# Patient Record
Sex: Male | Born: 1935 | Race: White | Hispanic: No | Marital: Married | Smoking: Never smoker
Health system: Southern US, Community
[De-identification: ages and names within clinical notes are randomized; demographics above are authoritative.]

## PROBLEM LIST (undated history)

## (undated) DIAGNOSIS — I739 Peripheral vascular disease, unspecified: Secondary | ICD-10-CM

## (undated) DIAGNOSIS — F0390 Unspecified dementia without behavioral disturbance: Secondary | ICD-10-CM

## (undated) DIAGNOSIS — I10 Essential (primary) hypertension: Secondary | ICD-10-CM

---

## 2020-03-01 ENCOUNTER — Other Ambulatory Visit: Payer: Self-pay

## 2020-03-01 ENCOUNTER — Inpatient Hospital Stay (HOSPITAL_COMMUNITY)
Admission: EM | Admit: 2020-03-01 | Discharge: 2020-03-05 | DRG: 963 | Disposition: A | Discharge status: Skilled Nursing Facility | Payer: Medicare PPO | Attending: Surgery | Admitting: Surgery

## 2020-03-01 DIAGNOSIS — W1830XA Fall on same level, unspecified, initial encounter: Secondary | ICD-10-CM | POA: Diagnosis present

## 2020-03-01 DIAGNOSIS — E162 Hypoglycemia, unspecified: Secondary | ICD-10-CM | POA: Diagnosis present

## 2020-03-01 DIAGNOSIS — S270XXA Traumatic pneumothorax, initial encounter: Secondary | ICD-10-CM | POA: Diagnosis present

## 2020-03-01 DIAGNOSIS — Z885 Allergy status to narcotic agent status: Secondary | ICD-10-CM

## 2020-03-01 DIAGNOSIS — Y92129 Unspecified place in nursing home as the place of occurrence of the external cause: Secondary | ICD-10-CM

## 2020-03-01 DIAGNOSIS — Z8546 Personal history of malignant neoplasm of prostate: Secondary | ICD-10-CM

## 2020-03-01 DIAGNOSIS — W19XXXA Unspecified fall, initial encounter: Secondary | ICD-10-CM

## 2020-03-01 DIAGNOSIS — Z8551 Personal history of malignant neoplasm of bladder: Secondary | ICD-10-CM

## 2020-03-01 DIAGNOSIS — S066X1A Traumatic subarachnoid hemorrhage with loss of consciousness of 30 minutes or less, initial encounter: Secondary | ICD-10-CM

## 2020-03-01 DIAGNOSIS — R402243 Coma scale, best verbal response, confused conversation, at hospital admission: Secondary | ICD-10-CM | POA: Diagnosis present

## 2020-03-01 DIAGNOSIS — J939 Pneumothorax, unspecified: Secondary | ICD-10-CM

## 2020-03-01 DIAGNOSIS — H9190 Unspecified hearing loss, unspecified ear: Secondary | ICD-10-CM | POA: Diagnosis present

## 2020-03-01 DIAGNOSIS — Z9221 Personal history of antineoplastic chemotherapy: Secondary | ICD-10-CM

## 2020-03-01 DIAGNOSIS — S066X9A Traumatic subarachnoid hemorrhage with loss of consciousness of unspecified duration, initial encounter: Principal | ICD-10-CM | POA: Diagnosis present

## 2020-03-01 DIAGNOSIS — Z79899 Other long term (current) drug therapy: Secondary | ICD-10-CM

## 2020-03-01 DIAGNOSIS — U071 COVID-19: Secondary | ICD-10-CM | POA: Diagnosis present

## 2020-03-01 DIAGNOSIS — R402143 Coma scale, eyes open, spontaneous, at hospital admission: Secondary | ICD-10-CM | POA: Diagnosis present

## 2020-03-01 DIAGNOSIS — F0390 Unspecified dementia without behavioral disturbance: Secondary | ICD-10-CM | POA: Diagnosis present

## 2020-03-01 DIAGNOSIS — N179 Acute kidney failure, unspecified: Secondary | ICD-10-CM

## 2020-03-01 DIAGNOSIS — R55 Syncope and collapse: Secondary | ICD-10-CM | POA: Diagnosis not present

## 2020-03-01 DIAGNOSIS — R402353 Coma scale, best motor response, localizes pain, at hospital admission: Secondary | ICD-10-CM | POA: Diagnosis present

## 2020-03-01 DIAGNOSIS — Z882 Allergy status to sulfonamides status: Secondary | ICD-10-CM

## 2020-03-01 DIAGNOSIS — Z881 Allergy status to other antibiotic agents status: Secondary | ICD-10-CM

## 2020-03-01 DIAGNOSIS — S2231XA Fracture of one rib, right side, initial encounter for closed fracture: Secondary | ICD-10-CM | POA: Diagnosis present

## 2020-03-01 DIAGNOSIS — I609 Nontraumatic subarachnoid hemorrhage, unspecified: Secondary | ICD-10-CM

## 2020-03-01 HISTORY — DX: Essential (primary) hypertension: I10

## 2020-03-01 HISTORY — DX: Unspecified dementia, unspecified severity, without behavioral disturbance, psychotic disturbance, mood disturbance, and anxiety: F03.90

## 2020-03-01 HISTORY — DX: Peripheral vascular disease, unspecified: I73.9

## 2020-03-01 LAB — BASIC METABOLIC PANEL
Anion gap: 17 — ABNORMAL HIGH (ref 5–15)
BUN: 45 mg/dL — ABNORMAL HIGH (ref 8–23)
CO2: 23 mmol/L (ref 22–32)
Calcium: 9.7 mg/dL (ref 8.9–10.3)
Chloride: 98 mmol/L (ref 98–111)
Creatinine, Ser: 3.07 mg/dL — ABNORMAL HIGH (ref 0.61–1.24)
GFR calc Af Amer: 21 mL/min — ABNORMAL LOW (ref >60)
GFR calc non Af Amer: 18 mL/min — ABNORMAL LOW (ref >60)
Glucose, Bld: 238 mg/dL — ABNORMAL HIGH (ref 70–99)
Potassium: 5 mmol/L (ref 3.5–5.1)
Sodium: 138 mmol/L (ref 135–145)

## 2020-03-01 LAB — CBC
HCT: 46.7 % (ref 39.0–52.0)
Hemoglobin: 15.3 g/dL (ref 13.0–17.0)
MCH: 31.4 pg (ref 26.0–34.0)
MCHC: 32.8 g/dL (ref 30.0–36.0)
MCV: 95.9 fL (ref 80.0–100.0)
Platelets: 336 10*3/uL (ref 150–400)
RBC: 4.87 MIL/uL (ref 4.22–5.81)
RDW: 13.7 % (ref 11.5–15.5)
WBC: 13.4 10*3/uL — ABNORMAL HIGH (ref 4.0–10.5)
nRBC: 0 % (ref 0.0–0.2)

## 2020-03-01 LAB — CBG MONITORING, ED: Glucose-Capillary: 226 mg/dL — ABNORMAL HIGH (ref 70–99)

## 2020-03-01 NOTE — ED Triage Notes (Signed)
Pt presents to Ed BIB [facility] from [facility]. Per EMS pt had fall and syncope after fall. Per EMS CBG read low. Ems given D10. CBG - 210. No blood thinner. AMS at baseline.  EMS VS:  134/82 HR - 110 CBG - 285 20 RAC

## 2020-03-02 ENCOUNTER — Emergency Department (HOSPITAL_COMMUNITY): Payer: Medicare PPO

## 2020-03-02 ENCOUNTER — Inpatient Hospital Stay (HOSPITAL_COMMUNITY): Payer: Medicare PPO

## 2020-03-02 DIAGNOSIS — S066X9A Traumatic subarachnoid hemorrhage with loss of consciousness of unspecified duration, initial encounter: Secondary | ICD-10-CM | POA: Diagnosis present

## 2020-03-02 DIAGNOSIS — F0390 Unspecified dementia without behavioral disturbance: Secondary | ICD-10-CM | POA: Diagnosis present

## 2020-03-02 DIAGNOSIS — W1830XA Fall on same level, unspecified, initial encounter: Secondary | ICD-10-CM | POA: Diagnosis present

## 2020-03-02 DIAGNOSIS — N179 Acute kidney failure, unspecified: Secondary | ICD-10-CM | POA: Diagnosis present

## 2020-03-02 DIAGNOSIS — Z882 Allergy status to sulfonamides status: Secondary | ICD-10-CM | POA: Diagnosis not present

## 2020-03-02 DIAGNOSIS — H9190 Unspecified hearing loss, unspecified ear: Secondary | ICD-10-CM | POA: Diagnosis present

## 2020-03-02 DIAGNOSIS — Y92129 Unspecified place in nursing home as the place of occurrence of the external cause: Secondary | ICD-10-CM | POA: Diagnosis not present

## 2020-03-02 DIAGNOSIS — R55 Syncope and collapse: Secondary | ICD-10-CM | POA: Diagnosis present

## 2020-03-02 DIAGNOSIS — R402353 Coma scale, best motor response, localizes pain, at hospital admission: Secondary | ICD-10-CM | POA: Diagnosis present

## 2020-03-02 DIAGNOSIS — Z79899 Other long term (current) drug therapy: Secondary | ICD-10-CM | POA: Diagnosis not present

## 2020-03-02 DIAGNOSIS — R402243 Coma scale, best verbal response, confused conversation, at hospital admission: Secondary | ICD-10-CM | POA: Diagnosis present

## 2020-03-02 DIAGNOSIS — S270XXA Traumatic pneumothorax, initial encounter: Secondary | ICD-10-CM | POA: Diagnosis present

## 2020-03-02 DIAGNOSIS — Z881 Allergy status to other antibiotic agents status: Secondary | ICD-10-CM | POA: Diagnosis not present

## 2020-03-02 DIAGNOSIS — E162 Hypoglycemia, unspecified: Secondary | ICD-10-CM | POA: Diagnosis present

## 2020-03-02 DIAGNOSIS — Z885 Allergy status to narcotic agent status: Secondary | ICD-10-CM | POA: Diagnosis not present

## 2020-03-02 DIAGNOSIS — I609 Nontraumatic subarachnoid hemorrhage, unspecified: Secondary | ICD-10-CM

## 2020-03-02 DIAGNOSIS — U071 COVID-19: Secondary | ICD-10-CM | POA: Diagnosis present

## 2020-03-02 DIAGNOSIS — Z8546 Personal history of malignant neoplasm of prostate: Secondary | ICD-10-CM | POA: Diagnosis not present

## 2020-03-02 DIAGNOSIS — R402143 Coma scale, eyes open, spontaneous, at hospital admission: Secondary | ICD-10-CM | POA: Diagnosis present

## 2020-03-02 DIAGNOSIS — Z9221 Personal history of antineoplastic chemotherapy: Secondary | ICD-10-CM | POA: Diagnosis not present

## 2020-03-02 DIAGNOSIS — S2231XA Fracture of one rib, right side, initial encounter for closed fracture: Secondary | ICD-10-CM | POA: Diagnosis present

## 2020-03-02 DIAGNOSIS — Z8551 Personal history of malignant neoplasm of bladder: Secondary | ICD-10-CM | POA: Diagnosis not present

## 2020-03-02 LAB — HEPATIC FUNCTION PANEL
ALT: 25 U/L (ref 0–44)
AST: 30 U/L (ref 15–41)
Albumin: 3.7 g/dL (ref 3.5–5.0)
Alkaline Phosphatase: 66 U/L (ref 38–126)
Bilirubin, Direct: 0.3 mg/dL — ABNORMAL HIGH (ref 0.0–0.2)
Indirect Bilirubin: 0.7 mg/dL (ref 0.3–0.9)
Total Bilirubin: 1 mg/dL (ref 0.3–1.2)
Total Protein: 7.1 g/dL (ref 6.5–8.1)

## 2020-03-02 LAB — BASIC METABOLIC PANEL
Anion gap: 12 (ref 5–15)
BUN: 47 mg/dL — ABNORMAL HIGH (ref 8–23)
CO2: 27 mmol/L (ref 22–32)
Calcium: 9.4 mg/dL (ref 8.9–10.3)
Chloride: 102 mmol/L (ref 98–111)
Creatinine, Ser: 2.65 mg/dL — ABNORMAL HIGH (ref 0.61–1.24)
GFR calc Af Amer: 25 mL/min — ABNORMAL LOW (ref >60)
GFR calc non Af Amer: 21 mL/min — ABNORMAL LOW (ref >60)
Glucose, Bld: 162 mg/dL — ABNORMAL HIGH (ref 70–99)
Potassium: 4.4 mmol/L (ref 3.5–5.1)
Sodium: 141 mmol/L (ref 135–145)

## 2020-03-02 LAB — CBG MONITORING, ED: Glucose-Capillary: 190 mg/dL — ABNORMAL HIGH (ref 70–99)

## 2020-03-02 LAB — SARS CORONAVIRUS 2 BY RT PCR (HOSPITAL ORDER, PERFORMED IN ~~LOC~~ HOSPITAL LAB): SARS Coronavirus 2: POSITIVE — AB

## 2020-03-02 MED ORDER — HALOPERIDOL LACTATE 5 MG/ML IJ SOLN
5.0000 mg | Freq: Four times a day (QID) | INTRAMUSCULAR | Status: DC | PRN
  Administered 2020-03-02 – 2020-03-05 (×6): 5 mg via INTRAVENOUS
  Filled 2020-03-02 (×7): qty 1

## 2020-03-02 MED ORDER — ACETAMINOPHEN 500 MG PO TABS
1000.0000 mg | ORAL_TABLET | Freq: Three times a day (TID) | ORAL | Status: DC
  Administered 2020-03-02 – 2020-03-05 (×9): 1000 mg via ORAL
  Filled 2020-03-02 (×9): qty 2

## 2020-03-02 MED ORDER — DOCUSATE SODIUM 100 MG PO CAPS
100.0000 mg | ORAL_CAPSULE | Freq: Two times a day (BID) | ORAL | Status: DC
  Administered 2020-03-02 – 2020-03-04 (×3): 100 mg via ORAL
  Filled 2020-03-02 (×4): qty 1

## 2020-03-02 MED ORDER — ONDANSETRON 4 MG PO TBDP: 4.0000 mg | ORAL_TABLET | Freq: Four times a day (QID) | ORAL | Status: DC | PRN

## 2020-03-02 MED ORDER — SODIUM CHLORIDE 0.9 % IV SOLN: 250.0000 mL | INTRAVENOUS | Status: DC | PRN

## 2020-03-02 MED ORDER — SODIUM CHLORIDE 0.9% FLUSH: 3.0000 mL | INTRAVENOUS | Status: DC | PRN

## 2020-03-02 MED ORDER — LEVETIRACETAM 500 MG PO TABS
500.0000 mg | ORAL_TABLET | Freq: Two times a day (BID) | ORAL | Status: DC
  Administered 2020-03-02 – 2020-03-05 (×7): 500 mg via ORAL
  Filled 2020-03-02 (×7): qty 1

## 2020-03-02 MED ORDER — METHOCARBAMOL 500 MG PO TABS
1000.0000 mg | ORAL_TABLET | Freq: Three times a day (TID) | ORAL | Status: DC
  Administered 2020-03-02 – 2020-03-05 (×10): 1000 mg via ORAL
  Filled 2020-03-02 (×10): qty 2

## 2020-03-02 MED ORDER — SODIUM CHLORIDE 0.9% FLUSH
3.0000 mL | Freq: Two times a day (BID) | INTRAVENOUS | Status: DC
  Administered 2020-03-02 – 2020-03-05 (×6): 3 mL via INTRAVENOUS

## 2020-03-02 MED ORDER — ONDANSETRON HCL 4 MG/2ML IJ SOLN: 4.0000 mg | Freq: Four times a day (QID) | INTRAMUSCULAR | Status: DC | PRN

## 2020-03-02 MED ORDER — SODIUM CHLORIDE 0.9 % IV SOLN: INTRAVENOUS | Status: DC

## 2020-03-02 MED ORDER — ENOXAPARIN SODIUM 30 MG/0.3ML ~~LOC~~ SOLN
30.0000 mg | Freq: Two times a day (BID) | SUBCUTANEOUS | Status: DC
  Administered 2020-03-03: 30 mg via SUBCUTANEOUS
  Filled 2020-03-02: qty 0.3

## 2020-03-02 MED ORDER — ACETAMINOPHEN 500 MG PO TABS: 1000.0000 mg | ORAL_TABLET | Freq: Four times a day (QID) | ORAL | Status: DC

## 2020-03-02 NOTE — Progress Notes (Signed)
PT Cancellation/Discharge Note  Patient Details Name: Kareem Cathey MRN: [id number] DOB: Dec 07, 1932   Cancelled Treatment:    Reason Eval/Treat Not Completed: PT screened, no needs identified, will sign off   Attempted to call [facility] for information on pt's functional status with no answer. As coming to speak with pt's nurse, Eber Jones, she was on the phone with patient's wife. She reports patient has not ambulated in a very long time. She reports he also has not known family members for a very long time.    Jerolyn Center, PT Pager [phone]   Zena Amos 03/02/2020, 4:29 PM

## 2020-03-02 NOTE — H&P (Addendum)
Reason for Consult/Chief Complaint: fall, SAH Consultant: Rubin Payor, MD  Dylan Ball is an 86 y.o. male.   HPI: 38M with report of GLF at nursing home, [facility]. Reportedly no blood thinners. Patient unable to participate in history and history is obtained from EDP and chart review. EDP notes patient does not have a history of diabetes, however was hypoglycemic en route requiring administration of D10 with appropriate response. Reportedly has a recent COVID+ diagnosis, although is reportedly asymptomatic. Unknown date of positive test.   No past medical history on file. Reported history of bladder cancer.   No family history on file.  Social History:  has no history on file for tobacco use, alcohol use, and drug use.  Allergies:  Allergies  Allergen Reactions  . Bactrim [Sulfamethoxazole-Trimethoprim] Other (See Comments)    Unknown reaction - listed on [facility] 03/01/2020  . Butalbital-Asa-Caff-Codeine Other (See Comments)    Unknown reaction - listed on [facility] 03/01/2020   . Ciprofloxacin Other (See Comments)    Unknown reaction - listed on [facility] 03/01/2020   . Ciprofloxacin-Dexamethasone Other (See Comments)    Unknown reaction - listed on [facility] 03/01/2020   . Codeine Other (See Comments)    Unknown reaction - listed on [facility] 03/01/2020   . Guaifenesin-Codeine Other (See Comments)    Unknown reaction - listed on [facility] 03/01/2020   . Morphine And Related Other (See Comments)    Unknown reaction - listed on [facility] 03/01/2020   . Vicodin [Hydrocodone-Acetaminophen] Other (See Comments)    Unknown reaction - listed on [facility] 03/01/2020     Medications: I have reviewed the patient's current medications.  Results for orders placed or performed during the hospital encounter of 03/01/20 (from the past 48 hour(s))  Basic metabolic panel     Status: Abnormal   Collection Time: 03/01/20 10:02 PM  Result Value Ref Range   Sodium 138 135 - 145 mmol/L   Potassium 5.0 3.5 - 5.1 mmol/L   Chloride 98  98 - 111 mmol/L   CO2 23 22 - 32 mmol/L   Glucose, Bld 238 (H) 70 - 99 mg/dL    Comment: Glucose reference range applies only to samples taken after fasting for at least 8 hours.   BUN 45 (H) 8 - 23 mg/dL   Creatinine, Ser 7.25 (H) 0.61 - 1.24 mg/dL   Calcium 9.7 8.9 - 36.6 mg/dL   GFR calc non Af Amer 18 (L) >60 mL/min   GFR calc Af Amer 21 (L) >60 mL/min   Anion gap 17 (H) 5 - 15    Comment: Performed at [facility], [address], [place], [place] [address]  CBC     Status: Abnormal   Collection Time: 03/01/20 10:02 PM  Result Value Ref Range   WBC 13.4 (H) 4.0 - 10.5 K/uL   RBC 4.87 4.22 - 5.81 MIL/uL   Hemoglobin 15.3 13.0 - 17.0 g/dL   HCT 74.2 39 - 52 %   MCV 95.9 80.0 - 100.0 fL   MCH 31.4 26.0 - 34.0 pg   MCHC 32.8 30.0 - 36.0 g/dL   RDW 59.5 63.8 - 75.6 %   Platelets 336 150 - 400 K/uL   nRBC 0.0 0.0 - 0.2 %    Comment: Performed at [facility], [address], [place], [place] [address]  Hepatic function panel     Status: Abnormal   Collection Time: 03/01/20 10:02 PM  Result Value Ref Range  Total Protein 7.1 6.5 - 8.1 g/dL   Albumin 3.7 3.5 - 5.0 g/dL   AST 30 15 - 41 U/L   ALT 25 0 - 44 U/L   Alkaline Phosphatase 66 38 - 126 U/L   Total Bilirubin 1.0 0.3 - 1.2 mg/dL   Bilirubin, Direct 0.3 (H) 0.0 - 0.2 mg/dL   Indirect Bilirubin 0.7 0.3 - 0.9 mg/dL    Comment: Performed at [facility], [address], [place], [place] [address]  CBG monitoring, ED     Status: Abnormal   Collection Time: 03/01/20 10:15 PM  Result Value Ref Range   Glucose-Capillary 226 (H) 70 - 99 mg/dL    Comment: Glucose reference range applies only to samples taken after fasting for at least 8 hours.  CBG monitoring, ED     Status: Abnormal   Collection Time: 03/02/20 12:56 AM  Result Value Ref Range   Glucose-Capillary 190 (H) 70 - 99 mg/dL    Comment: Glucose reference range applies only to samples taken after fasting for at least 8 hours.    DG Chest 1  View  Addendum Date: 03/02/2020   ADDENDUM REPORT: 03/02/2020 02:00 ADDENDUM: These results were called by telephone at the time of interpretation on 03/02/2020 at 2:00 am to provider [facility] , who verbally acknowledged these results. Electronically Signed   By: Helyn Numbers MD   On: 03/02/2020 02:00   Result Date: 03/02/2020 CLINICAL DATA:  Altered mental status, COVID pneumonia EXAM: CHEST  1 VIEW COMPARISON:  None. FINDINGS: Small right apical pneumothorax is present. Minimal right basilar atelectasis. Lungs are otherwise clear. No pleural effusion. Cardiac size within normal limits. No acute bone abnormality. IMPRESSION: Small right apical pneumothorax. Electronically Signed: By: Helyn Numbers MD On: 03/02/2020 01:57   CT Head Wo Contrast  Result Date: 03/02/2020 CLINICAL DATA:  86 year old male with trauma. EXAM: CT HEAD WITHOUT CONTRAST CT CERVICAL SPINE WITHOUT CONTRAST TECHNIQUE: Multidetector CT imaging of the head and cervical spine was performed following the standard protocol without intravenous contrast. Multiplanar CT image reconstructions of the cervical spine were also generated. COMPARISON:  None. FINDINGS: CT HEAD FINDINGS Brain: There is a trace right temporal subarachnoid hemorrhage (13/4) as well as trace right posterior temporal/parietal subarachnoid hemorrhage (16/4). There is mild age-related atrophy and chronic microvascular ischemic changes. No mass effect or midline shift. Vascular: No hyperdense vessel or unexpected calcification. Skull: Normal. Negative for fracture or focal lesion. Sinuses/Orbits: There is a 2 cm left maxillary sinus retention cyst or polyp. There is partial opacification of the sphenoid air cells with air-fluid level. The mastoid air cells are clear. Other: None CT CERVICAL SPINE FINDINGS Alignment: No acute subluxation. Skull base and vertebrae: No acute fracture.  Osteopenia. Soft tissues and spinal canal: No prevertebral fluid or swelling. No  visible canal hematoma. Disc levels:  Multilevel degenerative changes. Upper chest: Partially visualized small right pneumothorax. Further evaluation with chest CT is recommended. Other: Bilateral thyroid nodules as well as advanced bilateral carotid bulb calcified plaques. IMPRESSION: 1. Trace right temporal and parietal subarachnoid hemorrhage. 2. No acute/traumatic cervical spine pathology. 3. Partially visualized small right pneumothorax. Further evaluation with chest CT is recommended. These results were called by telephone at the time of interpretation on 03/02/2020 at 1:42 am to provider [facility] , who verbally acknowledged these results. Electronically Signed   By: Elgie Collard M.D.   On: 03/02/2020 01:43   CT Cervical Spine Wo Contrast  Result Date: 03/02/2020 CLINICAL  DATA:  86 year old male with trauma. EXAM: CT HEAD WITHOUT CONTRAST CT CERVICAL SPINE WITHOUT CONTRAST TECHNIQUE: Multidetector CT imaging of the head and cervical spine was performed following the standard protocol without intravenous contrast. Multiplanar CT image reconstructions of the cervical spine were also generated. COMPARISON:  None. FINDINGS: CT HEAD FINDINGS Brain: There is a trace right temporal subarachnoid hemorrhage (13/4) as well as trace right posterior temporal/parietal subarachnoid hemorrhage (16/4). There is mild age-related atrophy and chronic microvascular ischemic changes. No mass effect or midline shift. Vascular: No hyperdense vessel or unexpected calcification. Skull: Normal. Negative for fracture or focal lesion. Sinuses/Orbits: There is a 2 cm left maxillary sinus retention cyst or polyp. There is partial opacification of the sphenoid air cells with air-fluid level. The mastoid air cells are clear. Other: None CT CERVICAL SPINE FINDINGS Alignment: No acute subluxation. Skull base and vertebrae: No acute fracture.  Osteopenia. Soft tissues and spinal canal: No prevertebral fluid or swelling. No  visible canal hematoma. Disc levels:  Multilevel degenerative changes. Upper chest: Partially visualized small right pneumothorax. Further evaluation with chest CT is recommended. Other: Bilateral thyroid nodules as well as advanced bilateral carotid bulb calcified plaques. IMPRESSION: 1. Trace right temporal and parietal subarachnoid hemorrhage. 2. No acute/traumatic cervical spine pathology. 3. Partially visualized small right pneumothorax. Further evaluation with chest CT is recommended. These results were called by telephone at the time of interpretation on 03/02/2020 at 1:42 am to provider [facility] , who verbally acknowledged these results. Electronically Signed   By: Elgie Collard M.D.   On: 03/02/2020 01:43    ROS 10 point review of systems is negative except as listed above in HPI.   Physical Exam Blood pressure (!) 150/60, pulse (!) 106, temperature (!) 97.5 F (36.4 C), resp. rate 18, SpO2 93 %. Constitutional: well-developed, well-nourished HEENT: pupils equal, round, reactive to light, 18mm b/l, moist conjunctiva, external inspection of ears and nose normal, hearing intact Oropharynx: normal oropharyngeal mucosa, poor dentition Neck: no thyromegaly, trachea midline, no midline cervical tenderness to palpation Chest: breath sounds equal bilaterally, normal respiratory effort, no midline or lateral chest wall tenderness to palpation/deformity Abdomen: soft, NT, no bruising, no hepatosplenomegaly GU: no blood at urethral meatus of penis, no scrotal masses or abnormality  Back: no wounds, no thoracic/lumbar spine tenderness to palpation, no thoracic/lumbar spine stepoffs Rectal: deferred Extremities: 2+ radial and pedal pulses bilaterally, motor and sensation intact to bilateral UE and LE, + peripheral edema MSK: unable to assess gait/station, no clubbing/cyanosis of fingers/toes, unable to assess ROM of all four extremities 2/2 patient participation Skin: warm, dry, no  rashes Psych: non-verbal, intermittently follows very simple commands    Assessment/Plan: 8M s/p fall  Bilateral SAH - very tiny, NSGY c/s pending, would not plan to repeat head CT unless recommended by NSGY, keppra x7d for sz ppx Small R PTX - conservative management with IS/pulm toilet and O2 via [place] AKI vs CKD - more likely CKD given complete clinical picture, although no historical data available. Will not hydrate given obvious LE peripheral edema Dementia - behavior redirection, prn haldol COVID - isolation precautions, symptomatic treatment Incomplete workup - CT C/A/P pending, all non-contrast given renal dysfunction  FEN - soft diet DVT - SCDs, consider starting LMWH 8/25 Dispo - admit to inpatient, progressive unit  Expected disposition: SNF Expected date of discharge: 03/04/2020   Diamantina Monks, MD General and Trauma Surgery [facility]

## 2020-03-02 NOTE — ED Notes (Signed)
Attempted report x1. 

## 2020-03-02 NOTE — Evaluation (Signed)
Speech Language Pathology Evaluation Patient Details Name: Dylan Ball MRN: [id number] DOB: 1933-02-09 Today's Date: 03/02/2020 Time: 3474-2595 SLP Time Calculation (min) (ACUTE ONLY): 15 min  Problem List:  Patient Active Problem List   Diagnosis Date Noted  . SAH (subarachnoid hemorrhage) (HCC) 03/02/2020   Past Medical History: No past medical history on file. Past Surgical History: The histories are not reviewed yet. Please review them in the "History" navigator section and refresh this SmartLink. HPI:  86yo male admitted 03/01/20 after fall and syncope at [facility]. PMH: Covid +, dementia. CXR = Small right apical pneumothorax is present. Minimal right basilaratelectasis. Lungs are otherwise clear. CTHead = Trace right temporal and parietal subarachnoid hemorrhage.   Assessment / Plan / Recommendation Clinical Impression  Pt seen at bedside for cognitive linguistic evaluation. Pt presents with a history of dementia, but no family is present to discuss severity. Today, pt is awake, asking "pull me up". Other phrase length material is unintelligible. Pt is unable to follow commands, and unable to tell me his name or DOB.    SLP Assessment  SLP Recommendation/Assessment: Patient needs continued Speech Language Pathology Services SLP Visit Diagnosis: Cognitive communication deficit (R41.841)    Follow Up Recommendations  24 hour supervision/assistance;Skilled Nursing facility    Frequency and Duration min 1 x/week  1 week ST will follow briefly for education     SLP Evaluation Cognition  Overall Cognitive Status: History of cognitive impairments - at baseline Arousal/Alertness: Awake/alert Orientation Level: Disoriented to person;Disoriented to place;Disoriented to time;Disoriented to situation Attention: Focused Focused Attention: Appears intact       Comprehension  Auditory Comprehension Overall Auditory Comprehension: Impaired    Expression Expression Primary Mode  of Expression: Verbal (limited verbalizations)   Oral / Motor  Oral Motor/Sensory Function Overall Oral Motor/Sensory Function: Generalized oral weakness Motor Speech Overall Motor Speech: Impaired Intelligibility: Intelligibility reduced Phrase: 50-74% accurate Interfering Components: Inadequate dentition   GO                   Saliha Salts B. Murvin Natal, [facility], CCC-SLP Speech Language Pathologist Office: [phone] Pager: [phone]  Leigh Aurora 03/02/2020, 3:36 PM

## 2020-03-02 NOTE — Progress Notes (Signed)
[facility] Progress Note     Subjective: CC:  Alert, minimally responsive, not following commands, moans/mumbles. (GCS 10; E4V2M4)  AFVSS, AM labs pending Objective: Vital signs in last 24 hours: Temp:  [97.3 F (36.3 C)-97.5 F (36.4 C)] 97.5 F (36.4 C) (08/24 0235) Pulse Rate:  [47-116] 82 (08/24 0900) Resp:  [12-23] 12 (08/24 0900) BP: (105-166)/(54-98) 105/62 (08/24 0900) SpO2:  [93 %-100 %] 100 % (08/24 0900)    Intake/Output from previous day: No intake/output data recorded. Intake/Output this shift: No intake/output data recorded.  PE: Gen:  Alert, NAD HEENT: PERRL, external ears WNL, no blood in ear canal, nares patent, oral mucosa moist Card: tachycardic 107 bpm on monitor radial pulses 2+ BL Pulm:  TTP R lateral chest wall, Normal effort, clear to auscultation bilaterally Abd: Soft, non-tender, non-distended, bowel sounds present in all 4 quadrants, no HSM,  GU: external cath in place  Ext: mitts on hands, PVD BLE with venous stasis skin changes, no ulcerations or masses Skin: warm and dry, no rashes  Psych: A&Ox3   Lab Results:  Recent Labs    03/01/20 2202  WBC 13.4*  HGB 15.3  HCT 46.7  PLT 336   BMET Recent Labs    03/01/20 2202  NA 138  K 5.0  CL 98  CO2 23  GLUCOSE 238*  BUN 45*  CREATININE 3.07*  CALCIUM 9.7   PT/INR No results for input(s): LABPROT, INR in the last 72 hours. CMP     Component Value Date/Time   NA 138 03/01/2020 2202   K 5.0 03/01/2020 2202   CL 98 03/01/2020 2202   CO2 23 03/01/2020 2202   GLUCOSE 238 (H) 03/01/2020 2202   BUN 45 (H) 03/01/2020 2202   CREATININE 3.07 (H) 03/01/2020 2202   CALCIUM 9.7 03/01/2020 2202   PROT 7.1 03/01/2020 2202   ALBUMIN 3.7 03/01/2020 2202   AST 30 03/01/2020 2202   ALT 25 03/01/2020 2202   ALKPHOS 66 03/01/2020 2202   BILITOT 1.0 03/01/2020 2202   GFRNONAA 18 (L) 03/01/2020 2202   GFRAA 21 (L) 03/01/2020 2202   Lipase  No results found for:  LIPASE     Studies/Results: CT ABDOMEN PELVIS WO CONTRAST  Result Date: 03/02/2020 CLINICAL DATA:  Syncope and fall with abdominal trauma. EXAM: CT CHEST, ABDOMEN AND PELVIS WITHOUT CONTRAST TECHNIQUE: Multidetector CT imaging of the chest, abdomen and pelvis was performed following the standard protocol without IV contrast. COMPARISON:  None. FINDINGS: CT CHEST FINDINGS Cardiovascular: Normal heart size. No pericardial effusion. No evidence of great vessel injury. Mediastinum/Nodes: No hematoma or pneumomediastinum. Nodular thyroid with a 16 mm nodule in the right. No follow-up recommended unless clinically warranted (ref: J Am Coll Radiol. 2015 Feb;12(2): 143-50). Lungs/Pleura: Known right pneumothorax, estimated at 10-20%. There is dependent atelectasis on both sides. No overt contusion. No hemothorax. Musculoskeletal: Right ninth and tenth rib rib fractures with up to mild displacement. CT ABDOMEN PELVIS FINDINGS Hepatobiliary: No hepatic injury or perihepatic hematoma. Gallbladder is unremarkable Pancreas: Negative Spleen: No splenic injury or perisplenic hematoma. Adrenals/Urinary Tract: No adrenal hemorrhage or renal injury identified. Bladder is unremarkable. Borderline for nodule at the body of the left adrenal gland. Stomach/Bowel: No visible injury.  Left colonic diverticulosis. Vascular/Lymphatic: Atheromatous calcification that is multifocal. No acute vascular finding. Reproductive: Enlarged prostate, up lifting the bladder base. Other: No ascites or pneumoperitoneum. Musculoskeletal: No acute finding. 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 Prominent generalized osteopenia. Generalized lumbar spine degeneration. IMPRESSION: 1. 10-20%  right pneumothorax associated with ninth and tenth rib fractures. 2. No evidence of intra-abdominal injury. Electronically Signed   By: Marnee Spring M.D.   On: 03/02/2020 04:31   DG Chest 1 View  Addendum Date: 03/02/2020   ADDENDUM REPORT: 03/02/2020 02:00  ADDENDUM: These results were called by telephone at the time of interpretation on 03/02/2020 at 2:00 am to provider [facility] , who verbally acknowledged these results. Electronically Signed   By: Helyn Numbers MD   On: 03/02/2020 02:00   Result Date: 03/02/2020 CLINICAL DATA:  Altered mental status, COVID pneumonia EXAM: CHEST  1 VIEW COMPARISON:  None. FINDINGS: Small right apical pneumothorax is present. Minimal right basilar atelectasis. Lungs are otherwise clear. No pleural effusion. Cardiac size within normal limits. No acute bone abnormality. IMPRESSION: Small right apical pneumothorax. Electronically Signed: By: Helyn Numbers MD On: 03/02/2020 01:57   CT Head Wo Contrast  Result Date: 03/02/2020 CLINICAL DATA:  86 year old male with trauma. EXAM: CT HEAD WITHOUT CONTRAST CT CERVICAL SPINE WITHOUT CONTRAST TECHNIQUE: Multidetector CT imaging of the head and cervical spine was performed following the standard protocol without intravenous contrast. Multiplanar CT image reconstructions of the cervical spine were also generated. COMPARISON:  None. FINDINGS: CT HEAD FINDINGS Brain: There is a trace right temporal subarachnoid hemorrhage (13/4) as well as trace right posterior temporal/parietal subarachnoid hemorrhage (16/4). There is mild age-related atrophy and chronic microvascular ischemic changes. No mass effect or midline shift. Vascular: No hyperdense vessel or unexpected calcification. Skull: Normal. Negative for fracture or focal lesion. Sinuses/Orbits: There is a 2 cm left maxillary sinus retention cyst or polyp. There is partial opacification of the sphenoid air cells with air-fluid level. The mastoid air cells are clear. Other: None CT CERVICAL SPINE FINDINGS Alignment: No acute subluxation. Skull base and vertebrae: No acute fracture.  Osteopenia. Soft tissues and spinal canal: No prevertebral fluid or swelling. No visible canal hematoma. Disc levels:  Multilevel degenerative changes.  Upper chest: Partially visualized small right pneumothorax. Further evaluation with chest CT is recommended. Other: Bilateral thyroid nodules as well as advanced bilateral carotid bulb calcified plaques. IMPRESSION: 1. Trace right temporal and parietal subarachnoid hemorrhage. 2. No acute/traumatic cervical spine pathology. 3. Partially visualized small right pneumothorax. Further evaluation with chest CT is recommended. These results were called by telephone at the time of interpretation on 03/02/2020 at 1:42 am to provider [facility] , who verbally acknowledged these results. Electronically Signed   By: Elgie Collard M.D.   On: 03/02/2020 01:43   CT CHEST WO CONTRAST  Result Date: 03/02/2020 CLINICAL DATA:  Syncope and fall with abdominal trauma. EXAM: CT CHEST, ABDOMEN AND PELVIS WITHOUT CONTRAST TECHNIQUE: Multidetector CT imaging of the chest, abdomen and pelvis was performed following the standard protocol without IV contrast. COMPARISON:  None. FINDINGS: CT CHEST FINDINGS Cardiovascular: Normal heart size. No pericardial effusion. No evidence of great vessel injury. Mediastinum/Nodes: No hematoma or pneumomediastinum. Nodular thyroid with a 16 mm nodule in the right. No follow-up recommended unless clinically warranted (ref: J Am Coll Radiol. 2015 Feb;12(2): 143-50). Lungs/Pleura: Known right pneumothorax, estimated at 10-20%. There is dependent atelectasis on both sides. No overt contusion. No hemothorax. Musculoskeletal: Right ninth and tenth rib rib fractures with up to mild displacement. CT ABDOMEN PELVIS FINDINGS Hepatobiliary: No hepatic injury or perihepatic hematoma. Gallbladder is unremarkable Pancreas: Negative Spleen: No splenic injury or perisplenic hematoma. Adrenals/Urinary Tract: No adrenal hemorrhage or renal injury identified. Bladder is unremarkable. Borderline for nodule at the body of  the left adrenal gland. Stomach/Bowel: No visible injury.  Left colonic diverticulosis.  Vascular/Lymphatic: Atheromatous calcification that is multifocal. No acute vascular finding. Reproductive: Enlarged prostate, up lifting the bladder base. Other: No ascites or pneumoperitoneum. Musculoskeletal: No acute finding. 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 Prominent generalized osteopenia. Generalized lumbar spine degeneration. IMPRESSION: 1. 10-20% right pneumothorax associated with ninth and tenth rib fractures. 2. No evidence of intra-abdominal injury. Electronically Signed   By: Marnee Spring M.D.   On: 03/02/2020 04:31   CT Cervical Spine Wo Contrast  Result Date: 03/02/2020 CLINICAL DATA:  86 year old male with trauma. EXAM: CT HEAD WITHOUT CONTRAST CT CERVICAL SPINE WITHOUT CONTRAST TECHNIQUE: Multidetector CT imaging of the head and cervical spine was performed following the standard protocol without intravenous contrast. Multiplanar CT image reconstructions of the cervical spine were also generated. COMPARISON:  None. FINDINGS: CT HEAD FINDINGS Brain: There is a trace right temporal subarachnoid hemorrhage (13/4) as well as trace right posterior temporal/parietal subarachnoid hemorrhage (16/4). There is mild age-related atrophy and chronic microvascular ischemic changes. No mass effect or midline shift. Vascular: No hyperdense vessel or unexpected calcification. Skull: Normal. Negative for fracture or focal lesion. Sinuses/Orbits: There is a 2 cm left maxillary sinus retention cyst or polyp. There is partial opacification of the sphenoid air cells with air-fluid level. The mastoid air cells are clear. Other: None CT CERVICAL SPINE FINDINGS Alignment: No acute subluxation. Skull base and vertebrae: No acute fracture.  Osteopenia. Soft tissues and spinal canal: No prevertebral fluid or swelling. No visible canal hematoma. Disc levels:  Multilevel degenerative changes. Upper chest: Partially visualized small right pneumothorax. Further evaluation with chest CT is recommended. Other: Bilateral  thyroid nodules as well as advanced bilateral carotid bulb calcified plaques. IMPRESSION: 1. Trace right temporal and parietal subarachnoid hemorrhage. 2. No acute/traumatic cervical spine pathology. 3. Partially visualized small right pneumothorax. Further evaluation with chest CT is recommended. These results were called by telephone at the time of interpretation on 03/02/2020 at 1:42 am to provider [facility] , who verbally acknowledged these results. Electronically Signed   By: Elgie Collard M.D.   On: 03/02/2020 01:43    Anti-infectives: Anti-infectives (From admission, onward)   None     Assessment/Plan Bilateral SAH - very tiny, NSGY c/s pending, would not plan to repeat head CT unless recommended by NSGY, keppra x7d for sz ppx Small R PTX - conservative management with IS/pulm toilet and O2 via [place], repeat CXR today  R Rib FX 9-10- multimodal pain control, IS AKI vs CKD - SCr more likely CKD given complete clinical picture, although no historical data available. Will not hydrate given obvious LE peripheral edema Dementia - behavior redirection, prn haldol COVID - isolation precautions, symptomatic treatment  FEN - DYS3 diet, start NS @ 50 cc/hr DVT - SCDs, consider starting LMWH 8/25 Dispo - admit to inpatient, progressive unit, PT/OT/SLP  Expected disposition: SNF Expected date of discharge: 03/04/2020    LOS: 0 days    Hosie Spangle, [facility] Please see Amion for pager number during day hours 7:00am-4:30pm

## 2020-03-02 NOTE — TOC CAGE-AID Note (Signed)
Transition of Care [facility]) - CAGE-AID Screening   Patient Details  Name: Dylan Ball MRN: [id number] Date of Birth: 03/05/1933  Transition of Care [facility]) CM/SW Contact:    Jimmy Picket, LCSWA Phone Number: 03/02/2020, 3:28 PM   Clinical Narrative:  Pt was unable to participate in assessment due to being disoriented x4.  CAGE-AID Screening: Substance Abuse Screening unable to be completed due to: : Patient unable to participate               Isabella Stalling Clinical Social Worker [phone]

## 2020-03-02 NOTE — Evaluation (Signed)
Clinical/Bedside Swallow Evaluation Patient Details  Name: Dylan Ball MRN: [id number] Date of Birth: 05-22-1933  Today's Date: 03/02/2020 Time: SLP Start Time (ACUTE ONLY): 1505 SLP Stop Time (ACUTE ONLY): 1525 SLP Time Calculation (min) (ACUTE ONLY): 20 min  Past Medical History: No past medical history on file. Past Surgical History:  The histories are not reviewed yet. Please review them in the "History" navigator section and refresh this SmartLink.  HPI:  86yo male admitted 03/01/20 after fall and syncope at [facility]. PMH: Covid +, dementia. CXR = Small right apical pneumothorax is present. Minimal right basilar atelectasis. Lungs are otherwise clear. CTHead = Trace right temporal and parietal subarachnoid hemorrhage.   Assessment / Plan / Recommendation Clinical Impression  Limited BSE completed. Pt was awake and cooperative, but was unable to follow commands. He is edentulous. Oral cavity and lips were noted to be dry. Pt accepted trials of thin liquid and puree. No obvious oral issues or overt s/s aspiration following either consistency. Will downgrade diet to a more conservative one  - puree and thin liquids, given mentation. Safe swallow precautions left in pt room. SLP will follow for assessment of diet tolerance and readiness to advance textures.   SLP Visit Diagnosis: Dysphagia, unspecified (R13.10)    Aspiration Risk  Mild aspiration risk;Moderate aspiration risk    Diet Recommendation Dysphagia 1 (Puree);Thin liquid   Liquid Administration via: Straw Medication Administration: Crushed with puree Supervision: Full supervision/cueing for compensatory strategies;Staff to assist with self feeding Compensations: Minimize environmental distractions;Slow rate;Small sips/bites Postural Changes: Seated upright at 90 degrees;Remain upright for at least 30 minutes after po intake    Other  Recommendations Oral Care Recommendations: Oral care BID Other Recommendations: Have  oral suction available   Follow up Recommendations 24 hour supervision/assistance;Skilled Nursing facility      Frequency and Duration min 1 x/week  1 week;2 weeks       Prognosis Prognosis for Safe Diet Advancement: Fair Barriers to Reach Goals: Cognitive deficits      Swallow Study   General Date of Onset: 03/01/20 HPI: 86yo male admitted 03/01/20 after fall and syncope at [facility] PMH: Covid +, dementia. CXR = Small right apical pneumothorax is present. Minimal right basilaratelectasis. Lungs are otherwise clear. CTHead = Trace right temporal and parietal subarachnoid hemorrhage. Type of Study: Bedside Swallow Evaluation Previous Swallow Assessment: none found Diet Prior to this Study: Dysphagia 3 (soft);Dysphagia 1 (puree) Temperature Spikes Noted: No Respiratory Status: Room air History of Recent Intubation: No Behavior/Cognition: Alert;Confused;Doesn't follow directions;Requires cueing;Cooperative;Agitated Oral Cavity Assessment: Dry Oral Care Completed by SLP: No Oral Cavity - Dentition: Edentulous Self-Feeding Abilities: Total assist Patient Positioning: Upright in bed Baseline Vocal Quality: Normal Volitional Cough: Cognitively unable to elicit Volitional Swallow: Unable to elicit    Oral/Motor/Sensory Function Overall Oral Motor/Sensory Function: Generalized oral weakness   Ice Chips Ice chips: Not tested   Thin Liquid Thin Liquid: Within functional limits Presentation: Straw    Nectar Thick     Honey Thick     Puree Puree: Within functional limits Presentation: Spoon   Solid           Tanesha Arambula B. Murvin Natal, [facility], CCC-SLP Speech Language Pathologist Office: [phone] Pager: [phone]  Leigh Aurora 03/02/2020,3:57 PM

## 2020-03-02 NOTE — ED Provider Notes (Addendum)
[facility] Provider Note   CSN: [id number] Arrival date & time: 03/01/20  2115     History Chief Complaint  Patient presents with  . Fall  . COVID positive    Alice ReichertJames Roussel is a 86 y.o. male.  HPI level 5 caveat due to dementia. Patient brought [facility]. Reportedly had a fall and was unresponsive after the fall. Reportedly had a low CBG for EMS. Had been given 100 mL of D10. Not on blood thinners. Patient really cannot provide much history. Discussed with nurse at [facility] and they state that he has been there for 5 days from an unknown outside hospital after a Covid diagnosis. He has been asymptomatic for them. Unknown how long he was at the hospital or date of the positive test. He is not diabetic. Reported history of bladder cancer. Unknown baseline creatinine but per nurse does not think that he has kidney problems. Reportedly hit his head with the fall.    No past medical history on file.  Patient Active Problem List   Diagnosis Date Noted  . SAH (subarachnoid hemorrhage) (HCC) 03/02/2020         No family history on file.  Social History   Tobacco Use  . Smoking status: Not on file  Substance Use Topics  . Alcohol use: Not on file  . Drug use: Not on file    Home Medications Prior to Admission medications   Medication Sig Start Date End Date Taking? Authorizing Provider  acetaminophen (TYLENOL) 500 MG tablet Take 500 mg by mouth every 6 (six) hours as needed for headache (pain).   Yes [provider]  QUEtiapine (SEROQUEL) 25 MG tablet Take 25 mg by mouth at bedtime.   Yes [provider]  Melatonin 10 MG TABS Take 10 mg by mouth at bedtime.    [provider]  melatonin 3 MG TABS tablet Take 6 mg by mouth at bedtime.    [provider]    Allergies    Bactrim [sulfamethoxazole-trimethoprim], Butalbital-asa-caff-codeine, Ciprofloxacin,  Ciprofloxacin-dexamethasone, Codeine, Guaifenesin-codeine, Morphine and related, and Vicodin [hydrocodone-acetaminophen]  Review of Systems   Review of Systems  Unable to perform ROS: Dementia    Physical Exam Updated Vital Signs BP (!) 134/54   Pulse (!) 116   Temp (!) 97.5 F (36.4 C)   Resp (!) 23   SpO2 94%   Physical Exam Vitals reviewed.  Constitutional:      Comments: Sitting in bed with eyes closed.  HENT:     Head:     Comments: Hematoma to left forehead. Eyes:     Comments: Pupils reactive bilaterally  Neck:     Comments: No midline tenderness. Cardiovascular:     Rate and Rhythm: Regular rhythm.  Pulmonary:     Breath sounds: No wheezing or rhonchi.  Abdominal:     Tenderness: There is no abdominal tenderness.  Genitourinary:    Penis: Normal.      Comments: No suprapubic mass.  Does have swelling of the right hemiscrotum. Musculoskeletal:     Cervical back: Neck supple.     Comments: Skin tears to bilateral forearms without apparent underlying bony tenderness.  Skin:    General: Skin is warm.     Capillary Refill: Capillary refill takes less than 2 seconds.  Neurological:     Comments: Awake. Really cannot provide history. Will not follow commands for me.     ED Results / Procedures /  Treatments   Labs (all labs ordered are listed, but only abnormal results are displayed) Labs Reviewed  SARS CORONAVIRUS 2 BY RT PCR (HOSPITAL ORDER, PERFORMED IN [facility]) - Abnormal; Notable for the following components:      Result Value   SARS Coronavirus 2 POSITIVE (*)    All other components within normal limits  BASIC METABOLIC PANEL - Abnormal; Notable for the following components:   Glucose, Bld 238 (*)    BUN 45 (*)    Creatinine, Ser 3.07 (*)    GFR calc non Af Amer 18 (*)    GFR calc Af Amer 21 (*)    Anion gap 17 (*)    All other components within normal limits  CBC - Abnormal; Notable for the following components:   WBC 13.4 (*)     All other components within normal limits  HEPATIC FUNCTION PANEL - Abnormal; Notable for the following components:   Bilirubin, Direct 0.3 (*)    All other components within normal limits  CBG MONITORING, ED - Abnormal; Notable for the following components:   Glucose-Capillary 226 (*)    All other components within normal limits  CBG MONITORING, ED - Abnormal; Notable for the following components:   Glucose-Capillary 190 (*)    All other components within normal limits  URINALYSIS, ROUTINE W REFLEX MICROSCOPIC    EKG None  Radiology CT ABDOMEN PELVIS WO CONTRAST  Result Date: 03/02/2020 CLINICAL DATA:  Syncope and fall with abdominal trauma. EXAM: CT CHEST, ABDOMEN AND PELVIS WITHOUT CONTRAST TECHNIQUE: Multidetector CT imaging of the chest, abdomen and pelvis was performed following the standard protocol without IV contrast. COMPARISON:  None. FINDINGS: CT CHEST FINDINGS Cardiovascular: Normal heart size. No pericardial effusion. No evidence of great vessel injury. Mediastinum/Nodes: No hematoma or pneumomediastinum. Nodular thyroid with a 16 mm nodule in the right. No follow-up recommended unless clinically warranted (ref: J Am Coll Radiol. 2015 Feb;12(2): 143-50). Lungs/Pleura: Known right pneumothorax, estimated at 10-20%. There is dependent atelectasis on both sides. No overt contusion. No hemothorax. Musculoskeletal: Right ninth and tenth rib rib fractures with up to mild displacement. CT ABDOMEN PELVIS FINDINGS Hepatobiliary: No hepatic injury or perihepatic hematoma. Gallbladder is unremarkable Pancreas: Negative Spleen: No splenic injury or perisplenic hematoma. Adrenals/Urinary Tract: No adrenal hemorrhage or renal injury identified. Bladder is unremarkable. Borderline for nodule at the body of the left adrenal gland. Stomach/Bowel: No visible injury.  Left colonic diverticulosis. Vascular/Lymphatic: Atheromatous calcification that is multifocal. No acute vascular finding.  Reproductive: Enlarged prostate, up lifting the bladder base. Other: No ascites or pneumoperitoneum. Musculoskeletal: No acute finding. 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 Prominent generalized osteopenia. Generalized lumbar spine degeneration. IMPRESSION: 1. 10-20% right pneumothorax associated with ninth and tenth rib fractures. 2. No evidence of intra-abdominal injury. Electronically Signed   By: Marnee Spring M.D.   On: 03/02/2020 04:31   DG Chest 1 View  Addendum Date: 03/02/2020   ADDENDUM REPORT: 03/02/2020 02:00 ADDENDUM: These results were called by telephone at the time of interpretation on 03/02/2020 at 2:00 am to provider [facility] , who verbally acknowledged these results. Electronically Signed   By: Helyn Numbers MD   On: 03/02/2020 02:00   Result Date: 03/02/2020 CLINICAL DATA:  Altered mental status, COVID pneumonia EXAM: CHEST  1 VIEW COMPARISON:  None. FINDINGS: Small right apical pneumothorax is present. Minimal right basilar atelectasis. Lungs are otherwise clear. No pleural effusion. Cardiac size within normal limits. No acute bone abnormality. IMPRESSION: Small right  apical pneumothorax. Electronically Signed: By: Helyn Numbers MD On: 03/02/2020 01:57   CT Head Wo Contrast  Result Date: 03/02/2020 CLINICAL DATA:  86 year old male with trauma. EXAM: CT HEAD WITHOUT CONTRAST CT CERVICAL SPINE WITHOUT CONTRAST TECHNIQUE: Multidetector CT imaging of the head and cervical spine was performed following the standard protocol without intravenous contrast. Multiplanar CT image reconstructions of the cervical spine were also generated. COMPARISON:  None. FINDINGS: CT HEAD FINDINGS Brain: There is a trace right temporal subarachnoid hemorrhage (13/4) as well as trace right posterior temporal/parietal subarachnoid hemorrhage (16/4). There is mild age-related atrophy and chronic microvascular ischemic changes. No mass effect or midline shift. Vascular: No hyperdense vessel or unexpected  calcification. Skull: Normal. Negative for fracture or focal lesion. Sinuses/Orbits: There is a 2 cm left maxillary sinus retention cyst or polyp. There is partial opacification of the sphenoid air cells with air-fluid level. The mastoid air cells are clear. Other: None CT CERVICAL SPINE FINDINGS Alignment: No acute subluxation. Skull base and vertebrae: No acute fracture.  Osteopenia. Soft tissues and spinal canal: No prevertebral fluid or swelling. No visible canal hematoma. Disc levels:  Multilevel degenerative changes. Upper chest: Partially visualized small right pneumothorax. Further evaluation with chest CT is recommended. Other: Bilateral thyroid nodules as well as advanced bilateral carotid bulb calcified plaques. IMPRESSION: 1. Trace right temporal and parietal subarachnoid hemorrhage. 2. No acute/traumatic cervical spine pathology. 3. Partially visualized small right pneumothorax. Further evaluation with chest CT is recommended. These results were called by telephone at the time of interpretation on 03/02/2020 at 1:42 am to provider [facility] , who verbally acknowledged these results. Electronically Signed   By: Elgie Collard M.D.   On: 03/02/2020 01:43   CT CHEST WO CONTRAST  Result Date: 03/02/2020 CLINICAL DATA:  Syncope and fall with abdominal trauma. EXAM: CT CHEST, ABDOMEN AND PELVIS WITHOUT CONTRAST TECHNIQUE: Multidetector CT imaging of the chest, abdomen and pelvis was performed following the standard protocol without IV contrast. COMPARISON:  None. FINDINGS: CT CHEST FINDINGS Cardiovascular: Normal heart size. No pericardial effusion. No evidence of great vessel injury. Mediastinum/Nodes: No hematoma or pneumomediastinum. Nodular thyroid with a 16 mm nodule in the right. No follow-up recommended unless clinically warranted (ref: J Am Coll Radiol. 2015 Feb;12(2): 143-50). Lungs/Pleura: Known right pneumothorax, estimated at 10-20%. There is dependent atelectasis on both sides. No  overt contusion. No hemothorax. Musculoskeletal: Right ninth and tenth rib rib fractures with up to mild displacement. CT ABDOMEN PELVIS FINDINGS Hepatobiliary: No hepatic injury or perihepatic hematoma. Gallbladder is unremarkable Pancreas: Negative Spleen: No splenic injury or perisplenic hematoma. Adrenals/Urinary Tract: No adrenal hemorrhage or renal injury identified. Bladder is unremarkable. Borderline for nodule at the body of the left adrenal gland. Stomach/Bowel: No visible injury.  Left colonic diverticulosis. Vascular/Lymphatic: Atheromatous calcification that is multifocal. No acute vascular finding. Reproductive: Enlarged prostate, up lifting the bladder base. Other: No ascites or pneumoperitoneum. Musculoskeletal: No acute finding. 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 Prominent generalized osteopenia. Generalized lumbar spine degeneration. IMPRESSION: 1. 10-20% right pneumothorax associated with ninth and tenth rib fractures. 2. No evidence of intra-abdominal injury. Electronically Signed   By: Marnee Spring M.D.   On: 03/02/2020 04:31   CT Cervical Spine Wo Contrast  Result Date: 03/02/2020 CLINICAL DATA:  86 year old male with trauma. EXAM: CT HEAD WITHOUT CONTRAST CT CERVICAL SPINE WITHOUT CONTRAST TECHNIQUE: Multidetector CT imaging of the head and cervical spine was performed following the standard protocol without intravenous contrast. Multiplanar CT image reconstructions of the cervical  spine were also generated. COMPARISON:  None. FINDINGS: CT HEAD FINDINGS Brain: There is a trace right temporal subarachnoid hemorrhage (13/4) as well as trace right posterior temporal/parietal subarachnoid hemorrhage (16/4). There is mild age-related atrophy and chronic microvascular ischemic changes. No mass effect or midline shift. Vascular: No hyperdense vessel or unexpected calcification. Skull: Normal. Negative for fracture or focal lesion. Sinuses/Orbits: There is a 2 cm left maxillary sinus retention  cyst or polyp. There is partial opacification of the sphenoid air cells with air-fluid level. The mastoid air cells are clear. Other: None CT CERVICAL SPINE FINDINGS Alignment: No acute subluxation. Skull base and vertebrae: No acute fracture.  Osteopenia. Soft tissues and spinal canal: No prevertebral fluid or swelling. No visible canal hematoma. Disc levels:  Multilevel degenerative changes. Upper chest: Partially visualized small right pneumothorax. Further evaluation with chest CT is recommended. Other: Bilateral thyroid nodules as well as advanced bilateral carotid bulb calcified plaques. IMPRESSION: 1. Trace right temporal and parietal subarachnoid hemorrhage. 2. No acute/traumatic cervical spine pathology. 3. Partially visualized small right pneumothorax. Further evaluation with chest CT is recommended. These results were called by telephone at the time of interpretation on 03/02/2020 at 1:42 am to provider [facility] , who verbally acknowledged these results. Electronically Signed   By: Elgie Collard M.D.   On: 03/02/2020 01:43    Procedures Procedures (including critical care time)  Medications Ordered in ED Medications  acetaminophen (TYLENOL) tablet 1,000 mg (has no administration in time range)  docusate sodium (COLACE) capsule 100 mg (has no administration in time range)  ondansetron (ZOFRAN-ODT) disintegrating tablet 4 mg (has no administration in time range)    Or  ondansetron (ZOFRAN) injection 4 mg (has no administration in time range)  enoxaparin (LOVENOX) injection 30 mg (has no administration in time range)  sodium chloride flush (NS) 0.9 % injection 3 mL (has no administration in time range)  sodium chloride flush (NS) 0.9 % injection 3 mL (has no administration in time range)  0.9 %  sodium chloride infusion (has no administration in time range)  methocarbamol (ROBAXIN) tablet 1,000 mg (has no administration in time range)  haloperidol lactate (HALDOL) injection 5 mg  (5 mg Intravenous Given 03/02/20 0540)    ED Course  I have reviewed the triage vital signs and the nursing notes.  Pertinent labs & imaging results that were available during my care of the patient were reviewed by me and considered in my medical decision making (see chart for details).    MDM Rules/Calculators/A&P                         Patient with fall.  Mental status change.  Reportedly hypoglycemic initially.  Unknown baseline creatinine but now is at 3.  Has subarachnoid hemorrhage.  Also has pneumothorax and 2 rib fractures.  Admit to trauma surgery. Patient has known Covid disease.  Although unsure when symptoms started and patient could be out of his infections window.  CRITICAL CARE Performed by: Benjiman Core Total critical care time: 30 minutes Critical care time was exclusive of separately billable procedures and treating other patients. Critical care was necessary to treat or prevent imminent or life-threatening deterioration. Critical care was time spent personally by me on the following activities: development of treatment plan with patient and/or surrogate as well as nursing, discussions with consultants, evaluation of patient's response to treatment, examination of patient, obtaining history from patient or surrogate, ordering and performing treatments and interventions, ordering  and review of laboratory studies, ordering and review of radiographic studies, pulse oximetry and re-evaluation of patient's condition. Final Clinical Impression(s) / ED Diagnoses Final diagnoses:  Fall, initial encounter  AKI (acute kidney injury) (HCC)  Pneumothorax, unspecified type  Subarachnoid hematoma, with loss of consciousness of 30 minutes or less, initial encounter [facility])    Rx / DC Orders ED Discharge Orders    None       Benjiman Core, MD 03/02/20 1610    Benjiman Core, MD 03/16/20 2002

## 2020-03-02 NOTE — ED Notes (Signed)
Patient very restless, attempting to get out of bed, redirection unsuccessful.

## 2020-03-03 ENCOUNTER — Inpatient Hospital Stay (HOSPITAL_COMMUNITY): Payer: Medicare PPO

## 2020-03-03 ENCOUNTER — Encounter (HOSPITAL_COMMUNITY): Payer: Self-pay

## 2020-03-03 LAB — CBC
HCT: 39.8 % (ref 39.0–52.0)
Hemoglobin: 13.4 g/dL (ref 13.0–17.0)
MCH: 31.5 pg (ref 26.0–34.0)
MCHC: 33.7 g/dL (ref 30.0–36.0)
MCV: 93.4 fL (ref 80.0–100.0)
Platelets: 269 10*3/uL (ref 150–400)
RBC: 4.26 MIL/uL (ref 4.22–5.81)
RDW: 13.8 % (ref 11.5–15.5)
WBC: 8.4 10*3/uL (ref 4.0–10.5)
nRBC: 0 % (ref 0.0–0.2)

## 2020-03-03 LAB — BASIC METABOLIC PANEL
Anion gap: 9 (ref 5–15)
BUN: 44 mg/dL — ABNORMAL HIGH (ref 8–23)
CO2: 27 mmol/L (ref 22–32)
Calcium: 9.1 mg/dL (ref 8.9–10.3)
Chloride: 105 mmol/L (ref 98–111)
Creatinine, Ser: 2.18 mg/dL — ABNORMAL HIGH (ref 0.61–1.24)
GFR calc Af Amer: 31 mL/min — ABNORMAL LOW (ref >60)
GFR calc non Af Amer: 27 mL/min — ABNORMAL LOW (ref >60)
Glucose, Bld: 144 mg/dL — ABNORMAL HIGH (ref 70–99)
Potassium: 4.3 mmol/L (ref 3.5–5.1)
Sodium: 141 mmol/L (ref 135–145)

## 2020-03-03 LAB — MRSA PCR SCREENING: MRSA by PCR: NEGATIVE

## 2020-03-03 MED ORDER — ENOXAPARIN SODIUM 30 MG/0.3ML ~~LOC~~ SOLN
30.0000 mg | Freq: Every day | SUBCUTANEOUS | Status: DC
  Administered 2020-03-04 – 2020-03-05 (×2): 30 mg via SUBCUTANEOUS
  Filled 2020-03-03 (×2): qty 0.3

## 2020-03-03 NOTE — Progress Notes (Signed)
OT Cancellation Note  Patient Details Name: Branch Pacitti MRN: [id number] DOB: 05-30-1933   Cancelled Treatment:    Reason Eval/Treat Not Completed: OT screened, no needs identified, will sign off- per PT patient from [facility] with assist required at baseline, no acute OT needs have been identified and will defer to SNF.   Barry Brunner, OT Acute Rehabilitation Services Pager [phone] Office [phone]   Chancy Milroy 03/03/2020, 8:03 AM

## 2020-03-03 NOTE — Progress Notes (Addendum)
[facility] Progress Note     Subjective: CC:  NAEO. Remains minimally responsive in mittens. GCS 10 for me again today - opens eyes spontaneously, not FC, moans, moves all extremities.   Per chart review (PT note) - patient has had significant dementia for a long time, doesn't remember family members, and has not walked for a while.   AFVSS, AM labs pending Objective: Vital signs in last 24 hours: Temp:  [97.5 F (36.4 C)-98.5 F (36.9 C)] 97.5 F (36.4 C) (08/25 0300) Pulse Rate:  [67-115] 82 (08/25 0300) Resp:  [12-21] 15 (08/25 0300) BP: (105-141)/(46-105) 137/61 (08/25 0300) SpO2:  [95 %-100 %] 97 % (08/25 0300) Weight:  [60.2 kg-69.7 kg] 69.7 kg (08/24 2036) Last BM Date: 03/02/20  Intake/Output from previous day: 08/24 0701 - 08/25 0700 In: 775 [P.O.:300; I.V.:475] Out: 300 [Urine:300] Intake/Output this shift: No intake/output data recorded.  PE: Gen:  Alert, NAD HEENT: PERRL, external ears WNL, no blood in ear canal, nares patent, oral mucosa moist Card: RRR (HR in 80's) on monitor radial pulses 2+ BL Pulm:  TTP R lateral chest wall, Normal effort, clear to auscultation bilaterally Abd: Soft, non-tender, non-distended, +BS GU: external cath in place with small amt dark urine in cannister  Ext: mitts on hands, PVD BLE with venous stasis skin changes, no ulcerations or masses, SCDs in place Skin: warm and dry, no rashes  Psych: A&Ox3   Lab Results:  Recent Labs    03/01/20 2202 03/03/20 0218  WBC 13.4* 8.4  HGB 15.3 13.4  HCT 46.7 39.8  PLT 336 269   BMET Recent Labs    03/02/20 1130 03/03/20 0218  NA 141 141  K 4.4 4.3  CL 102 105  CO2 27 27  GLUCOSE 162* 144*  BUN 47* 44*  CREATININE 2.65* 2.18*  CALCIUM 9.4 9.1   PT/INR No results for input(s): LABPROT, INR in the last 72 hours. CMP     Component Value Date/Time   NA 141 03/03/2020 0218   K 4.3 03/03/2020 0218   CL 105 03/03/2020 0218   CO2 27 03/03/2020 0218   GLUCOSE  144 (H) 03/03/2020 0218   BUN 44 (H) 03/03/2020 0218   CREATININE 2.18 (H) 03/03/2020 0218   CALCIUM 9.1 03/03/2020 0218   PROT 7.1 03/01/2020 2202   ALBUMIN 3.7 03/01/2020 2202   AST 30 03/01/2020 2202   ALT 25 03/01/2020 2202   ALKPHOS 66 03/01/2020 2202   BILITOT 1.0 03/01/2020 2202   GFRNONAA 27 (L) 03/03/2020 0218   GFRAA 31 (L) 03/03/2020 0218   Lipase  No results found for: LIPASE     Studies/Results: CT ABDOMEN PELVIS WO CONTRAST  Result Date: 03/02/2020 CLINICAL DATA:  Syncope and fall with abdominal trauma. EXAM: CT CHEST, ABDOMEN AND PELVIS WITHOUT CONTRAST TECHNIQUE: Multidetector CT imaging of the chest, abdomen and pelvis was performed following the standard protocol without IV contrast. COMPARISON:  None. FINDINGS: CT CHEST FINDINGS Cardiovascular: Normal heart size. No pericardial effusion. No evidence of great vessel injury. Mediastinum/Nodes: No hematoma or pneumomediastinum. Nodular thyroid with a 16 mm nodule in the right. No follow-up recommended unless clinically warranted (ref: J Am Coll Radiol. 2015 Feb;12(2): 143-50). Lungs/Pleura: Known right pneumothorax, estimated at 10-20%. There is dependent atelectasis on both sides. No overt contusion. No hemothorax. Musculoskeletal: Right ninth and tenth rib rib fractures with up to mild displacement. CT ABDOMEN PELVIS FINDINGS Hepatobiliary: No hepatic injury or perihepatic hematoma. Gallbladder is unremarkable Pancreas: Negative Spleen: No  splenic injury or perisplenic hematoma. Adrenals/Urinary Tract: No adrenal hemorrhage or renal injury identified. Bladder is unremarkable. Borderline for nodule at the body of the left adrenal gland. Stomach/Bowel: No visible injury.  Left colonic diverticulosis. Vascular/Lymphatic: Atheromatous calcification that is multifocal. No acute vascular finding. Reproductive: Enlarged prostate, up lifting the bladder base. Other: No ascites or pneumoperitoneum. Musculoskeletal: No acute finding.  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 Prominent generalized osteopenia. Generalized lumbar spine degeneration. IMPRESSION: 1. 10-20% right pneumothorax associated with ninth and tenth rib fractures. 2. No evidence of intra-abdominal injury. Electronically Signed   By: Marnee SpringJonathon  Watts M.D.   On: 03/02/2020 04:31   DG Chest 1 View  Addendum Date: 03/02/2020   ADDENDUM REPORT: 03/02/2020 02:00 ADDENDUM: These results were called by telephone at the time of interpretation on 03/02/2020 at 2:00 am to provider [facility] , who verbally acknowledged these results. Electronically Signed   By: Helyn NumbersAshesh  Parikh MD   On: 03/02/2020 02:00   Result Date: 03/02/2020 CLINICAL DATA:  Altered mental status, COVID pneumonia EXAM: CHEST  1 VIEW COMPARISON:  None. FINDINGS: Small right apical pneumothorax is present. Minimal right basilar atelectasis. Lungs are otherwise clear. No pleural effusion. Cardiac size within normal limits. No acute bone abnormality. IMPRESSION: Small right apical pneumothorax. Electronically Signed: By: Helyn NumbersAshesh  Parikh MD On: 03/02/2020 01:57   CT Head Wo Contrast  Result Date: 03/02/2020 CLINICAL DATA:  86 year old male with trauma. EXAM: CT HEAD WITHOUT CONTRAST CT CERVICAL SPINE WITHOUT CONTRAST TECHNIQUE: Multidetector CT imaging of the head and cervical spine was performed following the standard protocol without intravenous contrast. Multiplanar CT image reconstructions of the cervical spine were also generated. COMPARISON:  None. FINDINGS: CT HEAD FINDINGS Brain: There is a trace right temporal subarachnoid hemorrhage (13/4) as well as trace right posterior temporal/parietal subarachnoid hemorrhage (16/4). There is mild age-related atrophy and chronic microvascular ischemic changes. No mass effect or midline shift. Vascular: No hyperdense vessel or unexpected calcification. Skull: Normal. Negative for fracture or focal lesion. Sinuses/Orbits: There is a 2 cm left maxillary sinus retention cyst  or polyp. There is partial opacification of the sphenoid air cells with air-fluid level. The mastoid air cells are clear. Other: None CT CERVICAL SPINE FINDINGS Alignment: No acute subluxation. Skull base and vertebrae: No acute fracture.  Osteopenia. Soft tissues and spinal canal: No prevertebral fluid or swelling. No visible canal hematoma. Disc levels:  Multilevel degenerative changes. Upper chest: Partially visualized small right pneumothorax. Further evaluation with chest CT is recommended. Other: Bilateral thyroid nodules as well as advanced bilateral carotid bulb calcified plaques. IMPRESSION: 1. Trace right temporal and parietal subarachnoid hemorrhage. 2. No acute/traumatic cervical spine pathology. 3. Partially visualized small right pneumothorax. Further evaluation with chest CT is recommended. These results were called by telephone at the time of interpretation on 03/02/2020 at 1:42 am to [facility] , who verbally acknowledged these results. Electronically Signed   By: Elgie CollardArash  Radparvar M.D.   On: 03/02/2020 01:43   CT CHEST WO CONTRAST  Result Date: 03/02/2020 CLINICAL DATA:  Syncope and fall with abdominal trauma. EXAM: CT CHEST, ABDOMEN AND PELVIS WITHOUT CONTRAST TECHNIQUE: Multidetector CT imaging of the chest, abdomen and pelvis was performed following the standard protocol without IV contrast. COMPARISON:  None. FINDINGS: CT CHEST FINDINGS Cardiovascular: Normal heart size. No pericardial effusion. No evidence of great vessel injury. Mediastinum/Nodes: No hematoma or pneumomediastinum. Nodular thyroid with a 16 mm nodule in the right. No follow-up recommended unless clinically warranted (ref: J Am Coll  Radiol. 2015 Feb;12(2): 143-50). Lungs/Pleura: Known right pneumothorax, estimated at 10-20%. There is dependent atelectasis on both sides. No overt contusion. No hemothorax. Musculoskeletal: Right ninth and tenth rib rib fractures with up to mild displacement. CT ABDOMEN PELVIS  FINDINGS Hepatobiliary: No hepatic injury or perihepatic hematoma. Gallbladder is unremarkable Pancreas: Negative Spleen: No splenic injury or perisplenic hematoma. Adrenals/Urinary Tract: No adrenal hemorrhage or renal injury identified. Bladder is unremarkable. Borderline for nodule at the body of the left adrenal gland. Stomach/Bowel: No visible injury.  Left colonic diverticulosis. Vascular/Lymphatic: Atheromatous calcification that is multifocal. No acute vascular finding. Reproductive: Enlarged prostate, up lifting the bladder base. Other: No ascites or pneumoperitoneum. Musculoskeletal: No acute finding. 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 Prominent generalized osteopenia. Generalized lumbar spine degeneration. IMPRESSION: 1. 10-20% right pneumothorax associated with ninth and tenth rib fractures. 2. No evidence of intra-abdominal injury. Electronically Signed   By: Marnee Spring M.D.   On: 03/02/2020 04:31   CT Cervical Spine Wo Contrast  Result Date: 03/02/2020 CLINICAL DATA:  86 year old male with trauma. EXAM: CT HEAD WITHOUT CONTRAST CT CERVICAL SPINE WITHOUT CONTRAST TECHNIQUE: Multidetector CT imaging of the head and cervical spine was performed following the standard protocol without intravenous contrast. Multiplanar CT image reconstructions of the cervical spine were also generated. COMPARISON:  None. FINDINGS: CT HEAD FINDINGS Brain: There is a trace right temporal subarachnoid hemorrhage (13/4) as well as trace right posterior temporal/parietal subarachnoid hemorrhage (16/4). There is mild age-related atrophy and chronic microvascular ischemic changes. No mass effect or midline shift. Vascular: No hyperdense vessel or unexpected calcification. Skull: Normal. Negative for fracture or focal lesion. Sinuses/Orbits: There is a 2 cm left maxillary sinus retention cyst or polyp. There is partial opacification of the sphenoid air cells with air-fluid level. The mastoid air cells are clear. Other:  None CT CERVICAL SPINE FINDINGS Alignment: No acute subluxation. Skull base and vertebrae: No acute fracture.  Osteopenia. Soft tissues and spinal canal: No prevertebral fluid or swelling. No visible canal hematoma. Disc levels:  Multilevel degenerative changes. Upper chest: Partially visualized small right pneumothorax. Further evaluation with chest CT is recommended. Other: Bilateral thyroid nodules as well as advanced bilateral carotid bulb calcified plaques. IMPRESSION: 1. Trace right temporal and parietal subarachnoid hemorrhage. 2. No acute/traumatic cervical spine pathology. 3. Partially visualized small right pneumothorax. Further evaluation with chest CT is recommended. These results were called by telephone at the time of interpretation on 03/02/2020 at 1:42 am to provider [facility] , who verbally acknowledged these results. Electronically Signed   By: Elgie Collard M.D.   On: 03/02/2020 01:43    Anti-infectives: Anti-infectives (From admission, onward)   None     Assessment/Plan Bilateral SAH - very tiny, NSGY c/s pending, would not plan to repeat head CT unless recommended by NSGY, keppra x7d for sz ppx Small R PTX - conservative management with IS/pulm toilet and O2 via [place], repeat CXR this AM w/ no visible PTX - await final read by radiology R Rib FX 9-10- multimodal pain control, IS AKI vs CKD - SCr improving (3.07 > 2.65 > 2.18) Dementia - behavior redirection, prn haldol COVID - isolation precautions, symptomatic treatment, remains asymptomatic FEN - DYS1 diet,  NS at 50 cc/hr, will continue given marginal UOP, poor PO intake. But will closely monitory peripheral edema/volume status DVT - SCDs, consider starting LMWH pending neuro eval.  Dispo -  progressive unit, SLP, NS eval (Dr. Maisie Fus) - appreciate his assistance.  I called patients listed contact Industrial/product designer Nurse)  to try to confirm patients baseline medical issues/mentation. No answer.   Expected disposition:  SNF Expected date of discharge: 03/04/2020    LOS: 1 day   Hosie Spangle, [facility] Please see Amion for pager number during day hours 7:00am-4:30pm

## 2020-03-03 NOTE — Progress Notes (Signed)
  NEUROSURGERY PROGRESS NOTE   Received call from Trauma service regarding patient 86 year old with history of dementia who presented to ED after a fall at [facility] where he resides on 8/24. He underwent CT head which revealed trace right temporal and parietal SAH. Question if at neuro baseline but by report does move all extremities and has been unchanged since hospitalization. There is no role for NS intervention. Can be discharged from a NS perspective back to SNF when cleared per trauma. Complete 7 day course of keppra for routine seizure prophylaxis. No NS f/u.

## 2020-03-03 NOTE — Progress Notes (Signed)
SLP Cancellation Note  Patient Details Name: Dylan Ball MRN: [id number] DOB: 1932-07-11   Cancelled treatment:       Reason Eval/Treat Not Completed: Patient declined, no reason specified Pt asleep upon attempt, but per conversation with RN, pt is doing very well on Dysphagia 1 diet, consistent with baseline (per facility).  No s/s aspiration with meals or meds. Given that dysphagia 1 is consistent with baseline and pt is tolerating well, no further ST indicated. SLP service to sign off. RN in agreement; to re-consult should the need arise.  Delmon Andrada P. Caleigh Rabelo, M.S., CCC-SLP Speech-Language Pathologist Acute Rehabilitation Services Pager: [phone]  Susanne Borders Mandie Crabbe 03/03/2020, 10:37 AM

## 2020-03-03 NOTE — TOC Initial Note (Addendum)
Transition of Care [facility]) - Initial/Assessment Note    Patient Details  Name: Dylan Ball MRN: [id number] Date of Birth: 18-Apr-1933  Transition of Care [facility]) CM/SW Contact:    Glennon Mac, RN Phone Number: 03/03/2020, 4:51 PM  Clinical Narrative: Patient went on 03/02/2020 status post ground-level fall at nursing facility.  Patient sustained bilateral subarachnoid hemorrhages, small right pneumothorax.  Patient with history of dementia with recent Covid diagnosis with hospitalization.  Prior to admission patient resided [facility] skilled nursing facility.  Spoke with Derrill Memo in admissions at facility; she states patient able to return facility on 03/04/2020 if medically stable.  Will initiate FL2 and notify [facility] of need for return to facility.     [facility] Reference # [id number]; Faxed clinicals to [facility] per protocol.  With current waiver, patient may dc back to facility without official insurance authorization.  Pt must be out of restraints, including mittens, for 24h prior to dc back to SNF.              Expected Discharge Plan: Skilled Nursing Facility Barriers to Discharge: Continued Medical Work up          Expected Discharge Plan and Services Expected Discharge Plan: Skilled Nursing Facility   Discharge Planning Services: CM Consult   Living arrangements for the past 2 months: Skilled Nursing Facility                                      Prior Living Arrangements/Services Living arrangements for the past 2 months: Skilled Nursing Facility Lives with:: Facility Resident Patient language and need for interpreter reviewed:: Yes Do you feel safe going back to the place where you live?: Yes        Care giver support system in place?: Yes (comment)   Criminal Activity/Legal Involvement Pertinent to Current Situation/Hospitalization: No - Comment as needed  Activities of Daily Living Home Assistive Devices/Equipment: None ADL  Screening (condition at time of admission) Patient's cognitive ability adequate to safely complete daily activities?: No Is the patient deaf or have difficulty hearing?: Yes Does the patient have difficulty seeing, even when wearing glasses/contacts?: Yes Does the patient have difficulty concentrating, remembering, or making decisions?: Yes Patient able to express need for assistance with ADLs?: No Does the patient have difficulty dressing or bathing?: Yes Independently performs ADLs?: No Communication: Dependent Does the patient have difficulty walking or climbing stairs?: Yes Weakness of Legs: Both Weakness of Arms/Hands: None                   Emotional Assessment Appearance:: Appears stated age   Affect (typically observed): Unable to Assess Orientation: : Fluctuating Orientation (Suspected and/or reported Sundowners)      Admission diagnosis:  SAH (subarachnoid hemorrhage) (HCC) [I60.9] Pneumothorax, right [J93.9] AKI (acute kidney injury) (HCC) [N17.9] Fall, initial encounter [W19.XXXA] Subarachnoid hematoma, with loss of consciousness of 30 minutes or less, initial encounter (HCC) [S06.6X1A] Pneumothorax, unspecified type [J93.9] Patient Active Problem List   Diagnosis Date Noted  . SAH (subarachnoid hemorrhage) (HCC) 03/02/2020   PCP:  Charlott Rakes, MD Pharmacy:  No Pharmacies Listed    Social Determinants of Health (SDOH) Interventions    Readmission Risk Interventions No flowsheet data found.   Quintella Baton, RN, BSN  Trauma/Neuro ICU Case Manager [phone]

## 2020-03-03 NOTE — [place] FL2 (Signed)
[place] MEDICAID FL2 LEVEL OF CARE SCREENING TOOL     IDENTIFICATION  Patient Name: Dylan Ball Birthdate: January 26, 1933 Sex: male Admission Date (Current Location): 03/01/2020  [facility] and [place] Number:  Producer, television/film/video and Address:  [facility], [address], [place], [place] [address]      Provider Number: [id number]  Attending Physician Name and Address:  Md, Trauma, MD  Relative Name and Phone Number:  Newton Frutiger, wife  [phone]    Current Level of Care: Hospital Recommended Level of Care: Skilled Nursing Facility Prior Approval Number:    Date Approved/Denied:   PASRR Number:    Discharge Plan: SNF    Current Diagnoses: Patient Active Problem List   Diagnosis Date Noted  . SAH (subarachnoid hemorrhage) (HCC) 03/02/2020    Orientation RESPIRATION BLADDER Height & Weight        Normal Incontinent, External catheter Weight: 69.7 kg Height:  5\' 10"  (177.8 cm)  BEHAVIORAL SYMPTOMS/MOOD NEUROLOGICAL BOWEL NUTRITION STATUS      Continent Diet (Dysp 1 Thin liquids)  AMBULATORY STATUS COMMUNICATION OF NEEDS Skin   Total Care Verbally Normal                       Personal Care Assistance Level of Assistance  Bathing, Feeding, Dressing, Total care Bathing Assistance: Maximum assistance Feeding assistance: Maximum assistance Dressing Assistance: Maximum assistance Total Care Assistance: Maximum assistance   Functional Limitations Info  Hearing   Hearing Info: Impaired      SPECIAL CARE FACTORS FREQUENCY  PT (By licensed PT), OT (By licensed OT), Speech therapy     PT Frequency: 5 times weekly OT Frequency: 5 times weekly     Speech Therapy Frequency: 5 times weekly      Contractures Contractures Info: Not present    Additional Factors Info  Code Status, Allergies Code Status Info: Full code Allergies Info: Bactrim- unknown rxn; Butalbital-asa-caff-codeine-unknown rxn; Ciprofloxacin- unknown rxn;  Ciprofloxacin-dexamethasone-unknown rxn; Codeine-unknown rxn; Guaifenesin-codeine-unknown rxn; Morphine and related-unknown rxn; Vicodin(hydrocodone-acetaminophen)-unkonw rxn           Current Medications (03/03/2020):  This is the current hospital active medication list Current Facility-Administered Medications  Medication Dose Route Frequency Provider Last Rate Last Admin  . 0.9 %  sodium chloride infusion  250 mL Intravenous PRN 03/05/2020, MD      . 0.9 %  sodium chloride infusion   Intravenous Continuous Diamantina Monks, PA-C 50 mL/hr at 03/03/20 1009 New Bag at 03/03/20 1009  . acetaminophen (TYLENOL) tablet 1,000 mg  1,000 mg Oral Q8H [place], 03/05/20, PA-C   1,000 mg at 03/03/20 1414  . docusate sodium (COLACE) capsule 100 mg  100 mg Oral BID 03/05/20, MD   100 mg at 03/03/20 1008  . [START ON 03/04/2020] enoxaparin (LOVENOX) injection 30 mg  30 mg Subcutaneous Daily 03/06/2020, Crystal S, RPH      . haloperidol lactate (HALDOL) injection 5 mg  5 mg Intravenous Q6H PRN Merilynn Finland, MD   5 mg at 03/02/20 2042  . levETIRAcetam (KEPPRA) tablet 500 mg  500 mg Oral BID 2043, PA-C   500 mg at 03/03/20 1008  . methocarbamol (ROBAXIN) tablet 1,000 mg  1,000 mg Oral Q8H 03/05/20, MD   1,000 mg at 03/03/20 1414  . ondansetron (ZOFRAN-ODT) disintegrating tablet 4 mg  4 mg Oral Q6H PRN 03/05/20, MD       Or  .  ondansetron (ZOFRAN) injection 4 mg  4 mg Intravenous Q6H PRN Diamantina Monks, MD      . sodium chloride flush (NS) 0.9 % injection 3 mL  3 mL Intravenous Q12H Diamantina Monks, MD   3 mL at 03/02/20 2036  . sodium chloride flush (NS) 0.9 % injection 3 mL  3 mL Intravenous PRN Diamantina Monks, MD         Discharge Medications: Please see discharge summary for a list of discharge medications.  Relevant Imaging Results:  Relevant Lab Results:   Additional Information SS# [id number]    Quintella Baton, RN, BSN  Trauma/Neuro  ICU Case Manager [phone]

## 2020-03-04 LAB — BASIC METABOLIC PANEL
Anion gap: 10 (ref 5–15)
BUN: 40 mg/dL — ABNORMAL HIGH (ref 8–23)
CO2: 24 mmol/L (ref 22–32)
Calcium: 9 mg/dL (ref 8.9–10.3)
Chloride: 108 mmol/L (ref 98–111)
Creatinine, Ser: 1.68 mg/dL — ABNORMAL HIGH (ref 0.61–1.24)
GFR calc Af Amer: 43 mL/min — ABNORMAL LOW (ref >60)
GFR calc non Af Amer: 37 mL/min — ABNORMAL LOW (ref >60)
Glucose, Bld: 121 mg/dL — ABNORMAL HIGH (ref 70–99)
Potassium: 4.1 mmol/L (ref 3.5–5.1)
Sodium: 142 mmol/L (ref 135–145)

## 2020-03-04 LAB — CBC
HCT: 42.7 % (ref 39.0–52.0)
Hemoglobin: 14.2 g/dL (ref 13.0–17.0)
MCH: 31.8 pg (ref 26.0–34.0)
MCHC: 33.3 g/dL (ref 30.0–36.0)
MCV: 95.5 fL (ref 80.0–100.0)
Platelets: 292 10*3/uL (ref 150–400)
RBC: 4.47 MIL/uL (ref 4.22–5.81)
RDW: 14 % (ref 11.5–15.5)
WBC: 7.4 10*3/uL (ref 4.0–10.5)
nRBC: 0 % (ref 0.0–0.2)

## 2020-03-04 MED ORDER — POLYETHYLENE GLYCOL 3350 17 G PO PACK
17.0000 g | PACK | Freq: Every day | ORAL | Status: DC
  Administered 2020-03-05: 17 g via ORAL
  Filled 2020-03-04 (×2): qty 1

## 2020-03-04 MED ORDER — METHOCARBAMOL 500 MG PO TABS: 1000.0000 mg | ORAL_TABLET | Freq: Three times a day (TID) | ORAL | 0 refills | Status: AC

## 2020-03-04 MED ORDER — ENSURE ENLIVE PO LIQD
237.0000 mL | Freq: Three times a day (TID) | ORAL | Status: DC
  Administered 2020-03-04 – 2020-03-05 (×4): 237 mL via ORAL

## 2020-03-04 MED ORDER — METOPROLOL TARTRATE 5 MG/5ML IV SOLN
INTRAVENOUS | Status: AC
  Administered 2020-03-05: 5 mg via INTRAVENOUS
  Filled 2020-03-04: qty 5

## 2020-03-04 MED ORDER — DOCUSATE SODIUM 50 MG/5ML PO LIQD
100.0000 mg | Freq: Two times a day (BID) | ORAL | Status: DC
  Administered 2020-03-04 – 2020-03-05 (×2): 100 mg via ORAL
  Filled 2020-03-04 (×2): qty 10

## 2020-03-04 MED ORDER — DOCUSATE SODIUM 100 MG PO CAPS: 100.0000 mg | ORAL_CAPSULE | Freq: Two times a day (BID) | ORAL | 0 refills | Status: AC

## 2020-03-04 MED ORDER — METOPROLOL TARTRATE 5 MG/5ML IV SOLN: 5.0000 mg | Freq: Once | INTRAVENOUS | Status: AC

## 2020-03-04 MED ORDER — POLYETHYLENE GLYCOL 3350 17 G PO PACK: 17.0000 g | PACK | Freq: Every day | ORAL | 0 refills | Status: AC | PRN

## 2020-03-04 MED ORDER — LEVETIRACETAM 500 MG PO TABS: 500.0000 mg | ORAL_TABLET | Freq: Two times a day (BID) | ORAL | 0 refills | Status: AC

## 2020-03-04 MED ORDER — ENSURE ENLIVE PO LIQD: 237.0000 mL | Freq: Three times a day (TID) | ORAL | 12 refills | Status: AC

## 2020-03-04 MED ORDER — ACETAMINOPHEN 500 MG PO TABS: 1000.0000 mg | ORAL_TABLET | Freq: Three times a day (TID) | ORAL | 0 refills | Status: AC

## 2020-03-04 NOTE — TOC Progression Note (Signed)
Transition of Care [facility]) - Progression Note    Patient Details  Name: Dylan Ball MRN: [id number] Date of Birth: [birth year]  [facility]) CM/SW Contact  Glennon Mac, RN Phone Number: 03/04/2020, 4:25 PM  Clinical Narrative:   Pt unable to go back to SNF today, as still in mittens.  SNF considers mittens a restraint, and must be off for 24hrs prior to dc.  Please do not reapply mittens.      Expected Discharge Plan: Skilled Nursing Facility Barriers to Discharge: Continued Medical Work up  Expected Discharge Plan and Services Expected Discharge Plan: Skilled Nursing Facility   Discharge Planning Services: CM Consult   Living arrangements for the past 2 months: Skilled Nursing Facility                                       Social Determinants of Health (SDOH) Interventions    Readmission Risk Interventions No flowsheet data found.  Quintella Baton, RN, BSN  Trauma/Neuro ICU Case Manager [phone]

## 2020-03-04 NOTE — Plan of Care (Signed)
  Problem: Coping: Goal: Psychosocial and spiritual needs will be supported Outcome: Progressing   Problem: Clinical Measurements: Goal: Diagnostic test results will improve Outcome: Progressing   Problem: Pain Managment: Goal: General experience of comfort will improve Outcome: Progressing

## 2020-03-04 NOTE — Progress Notes (Signed)
Mittens removed from patient's hands bilaterally as requested by the trauma team. Patient is doing well without the mittens. Will inform next shift to leave mittens off so patient will be eligible to return to SNF soon.

## 2020-03-04 NOTE — Progress Notes (Addendum)
[facility] Progress Note     Subjective: CC:  NAEO. Alert. Speaking but I cannot make out what he is saying, appears comfortable   AFVSS, CBC WNL, BMP pending Objective: Vital signs in last 24 hours: Temp:  [97.2 F (36.2 C)-97.9 F (36.6 C)] 97.6 F (36.4 C) (08/26 0758) Pulse Rate:  [78-102] 101 (08/26 0758) Resp:  [16-20] 20 (08/26 0758) BP: (122-155)/(58-77) 132/77 (08/26 0758) SpO2:  [94 %-100 %] 94 % (08/26 0758) Weight:  [69.8 kg] 69.8 kg (08/26 0325) Last BM Date: 03/02/20  Intake/Output from previous day: 08/25 0701 - 08/26 0700 In: 953.4 [I.V.:953.4] Out: 550 [Urine:550] Intake/Output this shift: No intake/output data recorded.  PE: Gen:  Alert, NAD HEENT: PERRL, external ears WNL, no blood in ear canal, nares patent, oral mucosa moist Card: RRR (HR in 90's) on monitor radial pulses 2+ BL Pulm:  TTP R lateral chest wall, Normal effort, clear to auscultation bilaterally Abd: Soft, non-tender, non-distended, +BS GU: external cath in place Ext: mitts on hands, PVD BLE with venous stasis skin changes, no ulcerations or masses, SCDs in place Skin: warm and dry, no rashes  Psych: A&Ox3   Lab Results:  Recent Labs    03/03/20 0218 03/04/20 0658  WBC 8.4 7.4  HGB 13.4 14.2  HCT 39.8 42.7  PLT 269 292   BMET Recent Labs    03/02/20 1130 03/03/20 0218  NA 141 141  K 4.4 4.3  CL 102 105  CO2 27 27  GLUCOSE 162* 144*  BUN 47* 44*  CREATININE 2.65* 2.18*  CALCIUM 9.4 9.1   PT/INR No results for input(s): LABPROT, INR in the last 72 hours. CMP     Component Value Date/Time   NA 141 03/03/2020 0218   K 4.3 03/03/2020 0218   CL 105 03/03/2020 0218   CO2 27 03/03/2020 0218   GLUCOSE 144 (H) 03/03/2020 0218   BUN 44 (H) 03/03/2020 0218   CREATININE 2.18 (H) 03/03/2020 0218   CALCIUM 9.1 03/03/2020 0218   PROT 7.1 03/01/2020 2202   ALBUMIN 3.7 03/01/2020 2202   AST 30 03/01/2020 2202   ALT 25 03/01/2020 2202   ALKPHOS 66 03/01/2020  2202   BILITOT 1.0 03/01/2020 2202   GFRNONAA 27 (L) 03/03/2020 0218   GFRAA 31 (L) 03/03/2020 0218   Lipase  No results found for: LIPASE     Studies/Results: DG CHEST PORT 1 VIEW  Result Date: 03/03/2020 CLINICAL DATA:  Right pneumothorax. EXAM: PORTABLE CHEST 1 VIEW COMPARISON:  March 02, 2020. FINDINGS: The heart size and mediastinal contours are within normal limits. Mild bibasilar subsegmental atelectasis is noted. Minimal right apical pneumothorax is noted which is slightly decreased compared to prior exam. The visualized skeletal structures are unremarkable. IMPRESSION: Minimal right apical pneumothorax is noted which is slightly decreased compared to prior exam. Mild bibasilar subsegmental atelectasis is noted. Electronically Signed   By: Lupita Raider M.D.   On: 03/03/2020 08:28    Anti-infectives: Anti-infectives (From admission, onward)   None     Assessment/Plan Bilateral SAH - very tiny, NSGY said no acute surgical needs, would not plan to repeat head CT unless recommended by NSGY, keppra x7d for sz ppx Small R PTX - conservative management with IS/pulm toilet and O2 via [place], repeat CXR this AM w/ no visible PTX - await final read by radiology R Rib FX 9-10- multimodal pain control, IS AKI vs CKD - SCr improving (3.07 > 2.65 > 2.18) Dementia - behavior  redirection, prn haldol COVID - isolation precautions, remains asymptomatic FEN - DYS1 diet, IV saline locked, add ensure TID for poor PO intake DVT - SCDs, will start SQH after confirming with MD  Dispo -  progressive unit, BMP pending, medically stable for discharge to SNF pending bed availability   I spoke with patients wife, Oliva Bustard, yesterday for a while and confirmed his baseline - per wife his dementia has been getting worse over the last 4-6 weeks. States his baseline is not recognizing family members (includer her) most days, incontinence of bowel/bladder, poor PO intake, and non-ambulatory for the last month.  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 She states she and her husband were living together in [place] until they were both hospitalized at [facility] with COVID - she was discharged home on O2, he was discharged to SNF [facility]).  Expected disposition: SNF Expected date of discharge: 03/04/2020    LOS: 2 days   Hosie Spangle, [facility] Please see Amion for pager number during day hours 7:00am-4:30pm

## 2020-03-04 NOTE — Discharge Summary (Signed)
[facility] Discharge Summary   Patient ID: Dylan Ball MRN: [id number] DOB/AGE: 08/20/1932 86 y.o.  Admit date: 03/01/2020 Discharge date: 03/05/2020  Admitting Diagnosis: Bilateral SAH Small R pneumothorax R Rib FX 9-10  AKI   Discharge Diagnosis Patient Active Problem List   Diagnosis Date Noted  . SAH (subarachnoid hemorrhage) (HCC) 03/02/2020  R Rib fractures 9-10  Consultants Neurosurgery - Dr. Maisie Fus   Imaging: DG CHEST PORT 1 VIEW  Result Date: 03/03/2020 CLINICAL DATA:  Right pneumothorax. EXAM: PORTABLE CHEST 1 VIEW COMPARISON:  March 02, 2020. FINDINGS: The heart size and mediastinal contours are within normal limits. Mild bibasilar subsegmental atelectasis is noted. Minimal right apical pneumothorax is noted which is slightly decreased compared to prior exam. The visualized skeletal structures are unremarkable. IMPRESSION: Minimal right apical pneumothorax is noted which is slightly decreased compared to prior exam. Mild bibasilar subsegmental atelectasis is noted. Electronically Signed   By: Lupita Raider M.D.   On: 03/03/2020 08:28    Procedures None  HPI: 27M with report of ground level fall at nursing home, [facility]. Reportedly no blood thinners. Patient unable to participate in history due to advanced dementia and history was obtained from EDP and chart review. EDP notes patient does not have a history of diabetes, however was hypoglycemic en route requiring administration of D10 with appropriate response. Reportedly has a recent COVID+ diagnosis, although is reportedly asymptomatic. Unknown date of positive test.    Hospital Course:  Workup showed very small bilateral subarachnoid hemorrhage [facility]), R Rib FX 9-10, and small right pneumothorax.  Patient was admitted to the trauma service and neurosurgery was consulted. They did not recommend any surgical management of SAH, just keppra for 7 days of seizure ppx. Plan to repeat head CT only if  patient showed neurologic decline worsening. Repeat chest x-ray on hospital day 1 showed resolution of pneumothorax. Rib fractures were treated with multimodal pain control and incentive spirometry. The patients spouse was contacted and reported the patients baseline function was worsening dementia, not recognizing family members, non-ambulatory, poor oral intake, and incontinent of bowel and bladder. She also mentioned that he was born deaf and cannot hear anything without his hearing aids - he did not having hearing aids during his hospitalization and was not using sign language.  On 03/05/20 the patients vitals were stable, oxygenating on room air, AKI improving, pain controlled, tolerating PO, and stable for discharge back to skilled nursing facility.    Allergies as of 03/04/2020      Reactions   Bactrim [sulfamethoxazole-trimethoprim] Other (See Comments)   Unknown reaction - listed on [facility] 03/01/2020   Butalbital-asa-caff-codeine Other (See Comments)   Unknown reaction - listed on [facility] 03/01/2020   Ciprofloxacin Other (See Comments)   Unknown reaction - listed on [facility] 03/01/2020   Ciprofloxacin-dexamethasone Other (See Comments)   Unknown reaction - listed on [facility] 03/01/2020   Codeine Other (See Comments)   Unknown reaction - listed on [facility] 03/01/2020   Guaifenesin-codeine Other (See Comments)   Unknown reaction - listed on [facility] 03/01/2020   Morphine And Related Other (See Comments)   Unknown reaction - listed on [facility] 03/01/2020   Vicodin [hydrocodone-acetaminophen] Other (See Comments)   Unknown reaction - listed on [facility] 03/01/2020      Medication List    TAKE these medications   acetaminophen 500 MG tablet Commonly known as: TYLENOL Take 2 tablets (1,000 mg total) by mouth every 8 (eight) hours for 5 days. What changed:  how much to take  when to take this  reasons to take this   docusate sodium 100 MG capsule Commonly known as: COLACE Take 1 capsule (100 mg total) by mouth 2 (two)  times daily.   feeding supplement (ENSURE ENLIVE) Liqd Take 237 mLs by mouth 3 (three) times daily with meals.   levETIRAcetam 500 MG tablet Commonly known as: KEPPRA Take 1 tablet (500 mg total) by mouth 2 (two) times daily for 5 days.   Melatonin 10 MG Tabs Take 10 mg by mouth at bedtime. What changed: Another medication with the same name was removed. Continue taking this medication, and follow the directions you see here.   methocarbamol 500 MG tablet Commonly known as: ROBAXIN Take 2 tablets (1,000 mg total) by mouth every 8 (eight) hours for 3 days.   polyethylene glycol 17 g packet Commonly known as: MIRALAX / GLYCOLAX Take 17 g by mouth daily as needed for moderate constipation.   QUEtiapine 25 MG tablet Commonly known as: SEROQUEL Take 25 mg by mouth at bedtime.       Signed: Hosie Spangle, [facility] 03/04/2020, 12:29 PM

## 2020-03-05 LAB — CBC
HCT: 38.8 % — ABNORMAL LOW (ref 39.0–52.0)
Hemoglobin: 12.7 g/dL — ABNORMAL LOW (ref 13.0–17.0)
MCH: 30.9 pg (ref 26.0–34.0)
MCHC: 32.7 g/dL (ref 30.0–36.0)
MCV: 94.4 fL (ref 80.0–100.0)
Platelets: 225 10*3/uL (ref 150–400)
RBC: 4.11 MIL/uL — ABNORMAL LOW (ref 4.22–5.81)
RDW: 13.9 % (ref 11.5–15.5)
WBC: 7.6 10*3/uL (ref 4.0–10.5)
nRBC: 0 % (ref 0.0–0.2)

## 2020-03-05 LAB — BASIC METABOLIC PANEL
Anion gap: 7 (ref 5–15)
BUN: 36 mg/dL — ABNORMAL HIGH (ref 8–23)
CO2: 24 mmol/L (ref 22–32)
Calcium: 8.7 mg/dL — ABNORMAL LOW (ref 8.9–10.3)
Chloride: 110 mmol/L (ref 98–111)
Creatinine, Ser: 1.36 mg/dL — ABNORMAL HIGH (ref 0.61–1.24)
GFR calc Af Amer: 55 mL/min — ABNORMAL LOW (ref >60)
GFR calc non Af Amer: 47 mL/min — ABNORMAL LOW (ref >60)
Glucose, Bld: 155 mg/dL — ABNORMAL HIGH (ref 70–99)
Potassium: 4 mmol/L (ref 3.5–5.1)
Sodium: 141 mmol/L (ref 135–145)

## 2020-03-05 NOTE — Progress Notes (Signed)
Report called to [facility] and given to [place], LPN. [facility] has picked patient up and is en route to facility. PIV removed without difficult. All personal items sent with patient back to [facility].

## 2020-03-05 NOTE — Plan of Care (Signed)
  Problem: Activity: Goal: Risk for activity intolerance will decrease Outcome: Progressing   Problem: Nutrition: Goal: Adequate nutrition will be maintained Outcome: Progressing   Problem: Coping: Goal: Level of anxiety will decrease Outcome: Progressing   

## 2020-03-05 NOTE — TOC Transition Note (Addendum)
Transition of Care [facility]) - CM/SW Discharge Note   Patient Details  Name: Dylan Ball MRN: [id number] Date of Birth: [birth year]  Transition of Care [facility]) CM/SW Contact:  Glennon Mac, RN Phone Number: 03/05/2020, 1:41 PM   Clinical Narrative: Patient medically stable for discharge today and has been out of restraints for last 24 hours.  Plan discharge back to [facility] and rehab via PTR transport.  Patient will go to room 902-B at facility; bedside nurse to call report to [phone].  Notified bedside nurse of plan for discharge; [facility] notified for pickup at 2:00 PM or after per bedside nurse.(called at 1:45pm)    Final next level of care: Skilled Nursing Facility Barriers to Discharge: Barriers Resolved   Patient Goals and CMS Choice     Choice offered to / list presented to : Spouse  Discharge Placement   Existing PASRR number confirmed : 03/03/20          Patient chooses bed at: [facility] Patient to be transferred to facility by: [facility] Name of family member notified: Alla German Patient and family notified of of transfer: 03/05/20  Discharge Plan and Services   Discharge Planning Services: CM Consult                                 Social Determinants of Health (SDOH) Interventions     Readmission Risk Interventions Readmission Risk Prevention Plan 03/05/2020  Transportation Screening Complete  PCP or Specialist Appt within 5-7 Days Not Complete  Not Complete comments Pt discharging to SNF  Home Care Screening Not Complete  Home Care Screening Not Completed Comments Pt discharging to SNF  Medication Review (RN CM) Complete   Quintella Baton, RN, BSN  Trauma/Neuro ICU Case Manager [phone]

## 2020-03-05 NOTE — Progress Notes (Signed)
[facility] Progress Note     Subjective: CC:  Calm. Comfortable.   AFVSS, Creatinine continuing to improve, 1.36 today.  Objective: Vital signs in last 24 hours: Temp:  [97.5 F (36.4 C)-99 F (37.2 C)] 99 F (37.2 C) (08/27 0733) Pulse Rate:  [62-114] 70 (08/27 0733) Resp:  [16-20] 18 (08/27 0733) BP: (112-171)/(53-87) 119/53 (08/27 0733) SpO2:  [95 %-97 %] 96 % (08/27 0733) Last BM Date: 03/02/20  Intake/Output from previous day: 08/26 0701 - 08/27 0700 In: 1146.2 [P.O.:120; I.V.:1026.2] Out: 525 [Urine:525] Intake/Output this shift: No intake/output data recorded.  PE: Gen:  Alert, NAD HEENT: PERRL, external ears WNL, no blood in ear canal, nares patent, oral mucosa moist Card: RRR (HR in 90's) on monitor radial pulses 2+ BL Pulm:  TTP R lateral chest wall, Normal effort, clear to auscultation bilaterally Abd: Soft, non-tender, non-distended, +BS GU: external cath in place Ext: mitts on hands, PVD BLE with venous stasis skin changes, no ulcerations or masses, SCDs in place Skin: warm and dry, no rashes  Psych: A&Ox3   Lab Results:  Recent Labs    03/04/20 0658 03/05/20 0448  WBC 7.4 7.6  HGB 14.2 12.7*  HCT 42.7 38.8*  PLT 292 225   BMET Recent Labs    03/04/20 0658 03/05/20 0448  NA 142 141  K 4.1 4.0  CL 108 110  CO2 24 24  GLUCOSE 121* 155*  BUN 40* 36*  CREATININE 1.68* 1.36*  CALCIUM 9.0 8.7*   PT/INR No results for input(s): LABPROT, INR in the last 72 hours. CMP     Component Value Date/Time   NA 141 03/05/2020 0448   K 4.0 03/05/2020 0448   CL 110 03/05/2020 0448   CO2 24 03/05/2020 0448   GLUCOSE 155 (H) 03/05/2020 0448   BUN 36 (H) 03/05/2020 0448   CREATININE 1.36 (H) 03/05/2020 0448   CALCIUM 8.7 (L) 03/05/2020 0448   PROT 7.1 03/01/2020 2202   ALBUMIN 3.7 03/01/2020 2202   AST 30 03/01/2020 2202   ALT 25 03/01/2020 2202   ALKPHOS 66 03/01/2020 2202   BILITOT 1.0 03/01/2020 2202   GFRNONAA 47 (L) 03/05/2020  0448   GFRAA 55 (L) 03/05/2020 0448   Lipase  No results found for: LIPASE     Studies/Results: No results found.  Anti-infectives: Anti-infectives (From admission, onward)   None     Assessment/Plan Bilateral SAH - very tiny, NSGY said no acute surgical needs, would not plan to repeat head CT unless recommended by NSGY, keppra x7d for sz ppx Small R PTX - conservative management with IS/pulm toilet and O2 via [place], repeat CXR this AM w/ no visible PTX - await final read by radiology R Rib FX 9-10- multimodal pain control, IS AKI vs CKD - SCr improving (3.07 > 2.65 > 2.18) Dementia - behavior redirection, prn haldol COVID - isolation precautions, remains asymptomatic FEN - DYS1 diet, IV saline locked, add ensure TID for poor PO intake DVT - SCDs, will start SQH after confirming with MD  Dispo -  medically stable for discharge to SNF pending bed availability, he has remained out of restraints.  Expected disposition: SNF Expected date of discharge: 03/05/2020    LOS: 3 days   Hosie Spangle, [facility] Please see Amion for pager number during day hours 7:00am-4:30pm

## 2020-03-05 NOTE — Progress Notes (Signed)
Attempted to call report to [facility] was on hold for 16 minutes before being disconnected. Will try again when transport arrives.

## 2020-04-02 ENCOUNTER — Encounter (HOSPITAL_COMMUNITY): Payer: Self-pay | Admitting: Emergency Medicine

## 2020-04-02 ENCOUNTER — Emergency Department (HOSPITAL_COMMUNITY)
Admission: EM | Admit: 2020-04-02 | Discharge: 2020-04-03 | Disposition: A | Discharge status: Home / Self Care | Payer: Medicare PPO | Attending: Emergency Medicine | Admitting: Emergency Medicine

## 2020-04-02 ENCOUNTER — Other Ambulatory Visit: Payer: Self-pay

## 2020-04-02 ENCOUNTER — Emergency Department (HOSPITAL_COMMUNITY): Payer: Medicare PPO

## 2020-04-02 DIAGNOSIS — W19XXXA Unspecified fall, initial encounter: Secondary | ICD-10-CM

## 2020-04-02 DIAGNOSIS — I10 Essential (primary) hypertension: Secondary | ICD-10-CM | POA: Diagnosis not present

## 2020-04-02 DIAGNOSIS — S0101XA Laceration without foreign body of scalp, initial encounter: Secondary | ICD-10-CM

## 2020-04-02 DIAGNOSIS — Z79899 Other long term (current) drug therapy: Secondary | ICD-10-CM | POA: Insufficient documentation

## 2020-04-02 DIAGNOSIS — S0000XA Unspecified superficial injury of scalp, initial encounter: Secondary | ICD-10-CM | POA: Diagnosis present

## 2020-04-02 NOTE — ED Provider Notes (Signed)
[facility] EMERGENCY DEPARTMENT Provider Note   CSN: [id number] Arrival date & time: 04/02/20  1717     History Chief Complaint  Patient presents with   Dylan Ball is a 86 y.o. male.   Fall This is a new problem. Episode onset: unknonw. The problem has not changed since onset.Pertinent negatives include no chest pain, no abdominal pain, no headaches and no shortness of breath. Nothing aggravates the symptoms. Nothing relieves the symptoms. He has tried nothing for the symptoms. The treatment provided mild relief.       Past Medical History:  Diagnosis Date   Dementia (HCC)    Hypertension    Peripheral vascular disease [facility])     Patient Active Problem List   Diagnosis Date Noted   SAH (subarachnoid hemorrhage) (HCC) 03/02/2020    History reviewed. No pertinent surgical history.     No family history on file.  Social History   Tobacco Use   Smoking status: Never Smoker   Smokeless tobacco: Never Used  Vaping Use   Vaping Use: Never used  Substance Use Topics   Alcohol use: Not Currently   Drug use: Not Currently    Home Medications Prior to Admission medications   Medication Sig Start Date End Date Taking? Authorizing Provider  docusate sodium (COLACE) 100 MG capsule Take 1 capsule (100 mg total) by mouth 2 (two) times daily. 03/04/20   Adam Phenix, PA-C  feeding supplement, ENSURE ENLIVE, (ENSURE ENLIVE) LIQD Take 237 mLs by mouth 3 (three) times daily with meals. 03/04/20   Adam Phenix, PA-C  levETIRAcetam (KEPPRA) 500 MG tablet Take 1 tablet (500 mg total) by mouth 2 (two) times daily for 5 days. 03/04/20 03/09/20  Adam Phenix, PA-C  Melatonin 10 MG TABS Take 10 mg by mouth at bedtime.    [provider]  polyethylene glycol (MIRALAX / GLYCOLAX) 17 g packet Take 17 g by mouth daily as needed for moderate constipation. 03/04/20   Adam Phenix, PA-C  QUEtiapine (SEROQUEL) 25 MG  tablet Take 25 mg by mouth at bedtime.    [provider]    Allergies    Bactrim [sulfamethoxazole-trimethoprim], Butalbital-asa-caff-codeine, Ciprofloxacin, Ciprofloxacin-dexamethasone, Codeine, Guaifenesin-codeine, Morphine and related, and Vicodin [hydrocodone-acetaminophen]  Review of Systems   Review of Systems  Unable to perform ROS: Dementia  Respiratory: Negative for shortness of breath.   Cardiovascular: Negative for chest pain.  Gastrointestinal: Negative for abdominal pain.  Neurological: Negative for headaches.    Physical Exam Updated Vital Signs BP (!) 125/92 (BP Location: Right Arm)    Pulse (!) 105    Temp (!) 97.4 F (36.3 C) (Oral)    Resp 17    Ht 5\' 10"  (1.778 m)    Wt 69.8 kg    SpO2 98%    BMI 22.08 kg/m   Physical Exam Vitals and nursing note reviewed. Exam conducted with a chaperone present.  Constitutional:      General: He is not in acute distress.    Appearance: Normal appearance.  HENT:     Head: Normocephalic.     Comments: Small laceration to the left lateral frontal area, hemostatic, no crepitus no deformity nontender to palpation    Nose: No rhinorrhea.  Eyes:     General:        Right eye: No discharge.        Left eye: No discharge.     Conjunctiva/sclera: Conjunctivae normal.  Cardiovascular:  Rate and Rhythm: Normal rate and regular rhythm.  Pulmonary:     Effort: Pulmonary effort is normal.     Breath sounds: No stridor.  Abdominal:     General: Abdomen is flat. There is no distension.     Palpations: Abdomen is soft.  Musculoskeletal:        General: No deformity or signs of injury.  Skin:    General: Skin is warm and dry.  Neurological:     Mental Status: He is alert. Mental status is at baseline. He is disoriented.     Cranial Nerves: No cranial nerve deficit.     Sensory: No sensory deficit.     Motor: No weakness.     ED Results / Procedures / Treatments   Labs (all labs ordered are listed, but only  abnormal results are displayed) Labs Reviewed - No data to display  EKG None  Radiology CT Head Wo Contrast  Result Date: 04/02/2020 CLINICAL DATA:  Unwitnessed fall with laceration to left side of head. Dementia history. EXAM: CT HEAD WITHOUT CONTRAST TECHNIQUE: Contiguous axial images were obtained from the base of the skull through the vertex without intravenous contrast. COMPARISON:  Head CT 03/02/2020 FINDINGS: Brain: Stable degree of atrophy and chronic small vessel ischemia. Previous subarachnoid hemorrhage has resolved. No intracranial hemorrhage, mass effect, or midline shift. No hydrocephalus. The basilar cisterns are patent. No evidence of territorial infarct or acute ischemia. No extra-axial or intracranial fluid collection. Vascular: Atherosclerosis of skullbase vasculature without hyperdense vessel or abnormal calcification. Skull: No fracture or focal lesion. Sinuses/Orbits: Paranasal sinuses and mastoid air cells are clear. The visualized orbits are unremarkable. Previous left maxillary sinus retention cyst not included in the field of view. Other: Small left frontal scalp hematoma. IMPRESSION: 1. Small left frontal scalp hematoma. Previous right-sided subarachnoid hemorrhage has resolved. No acute intracranial abnormality. No skull fracture. 2. Stable atrophy and chronic small vessel ischemia. Electronically Signed   By: Narda Rutherford M.D.   On: 04/02/2020 20:52    Procedures Procedures (including critical care time)  Medications Ordered in ED Medications - No data to display  ED Course  I have reviewed the triage vital signs and the nursing notes.  Pertinent labs & imaging results that were available during my care of the patient were reviewed by me and considered in my medical decision making (see chart for details).    MDM Rules/Calculators/A&P                          Gentleman with a history of dementia comes with a unwitnessed fall, at his nursing home.  He was  sitting at a table eating, when he moved back he was on the ground with a small laceration to his forehead.  He has a history of traumatic brain injury.  CT imaging done today reviewed by radiology myself shows no acute intracranial abnormality.  They comment on resolution of his previous traumatic brain injury.  Patient is at his neurologic baseline per my interaction with him and my conversation with the nursing home.  He is resting comfortably he is not tender in any extremity he has no other signs of trauma, there is a small laceration to the forehead that does not need formal repair, and is clean and covered with bandage Final Clinical Impression(s) / ED Diagnoses Final diagnoses:  Fall, initial encounter  Laceration of scalp without foreign body, initial encounter    Rx / DC Orders  ED Discharge Orders    None       Sabino Donovan, MD 04/02/20 2257

## 2020-04-02 NOTE — Discharge Instructions (Addendum)
Keep the wound clean and dry, cover as needed, if it bleeds more, apply pressure to stop the bleeding, return with any concerns

## 2020-04-02 NOTE — ED Notes (Signed)
Called [facility] 

## 2020-04-02 NOTE — ED Notes (Signed)
Dylan Ball, [phone] would like an update

## 2020-04-02 NOTE — ED Triage Notes (Signed)
Pt brought to ED by [facility] from [facility] after unwitnessed fall with a left side head lac. Pt is AO to self on base line, Hx of dementia, no blood thinners. BP 170/96, HR 90, R 16, SPO2 96 RA CBG 224.

## 2020-05-28 ENCOUNTER — Non-Acute Institutional Stay: Payer: Medicare PPO | Admitting: Adult Health Nurse Practitioner

## 2020-05-28 ENCOUNTER — Other Ambulatory Visit: Payer: Self-pay

## 2020-05-28 DIAGNOSIS — E46 Unspecified protein-calorie malnutrition: Secondary | ICD-10-CM

## 2020-05-28 DIAGNOSIS — I2510 Atherosclerotic heart disease of native coronary artery without angina pectoris: Secondary | ICD-10-CM

## 2020-05-28 DIAGNOSIS — F0150 Vascular dementia without behavioral disturbance: Secondary | ICD-10-CM

## 2020-05-28 DIAGNOSIS — Z515 Encounter for palliative care: Secondary | ICD-10-CM

## 2020-05-28 NOTE — Progress Notes (Addendum)
Therapist, nutritional Palliative Care Consult Note Telephone: [phone]  Fax: [phone]  PATIENT NAME: Dylan Ball DOB: 17-Mar-1933 MRN: [id number]  PRIMARY CARE PROVIDER:   Charlott Rakes, MD  REFERRING PROVIDER:  Charlott Rakes, MD [address] [place],  [place] [address]  RESPONSIBLE PARTY:   Arne Schlender, wife [phone]  Chief Complaint:  Initial palliative consult   RECOMMENDATIONS and PLAN:  1.  Advanced care planning.  Spoke with wife about patient's decline functionally and nutritionally.  Discussed hospice services.  Wife would like to proceed with hospice.  2.  Functional status.  At admission to [facility] in August he was able to ambulate and would wander PPS 40%.  He is now wheelchair and bed bound and requires total care including feeding.  He is incontinent of B&B.  Patient sleeps most of the day.  Even during today's visit he does not open his eyes but will respond to staff member feeding him by opening his mouth and will slowly take bites. Continue supportive care at facility.    3.  Nutritional status.  He eats mostly sips and bites and on a good day will eat up to 25% of a meal.  PPS 30% nearing 20%.  August weighed 172 pounds with BMI 24.73 and now weighs 140 pounds with BMI of 20.13.  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  Was started on Remeron with little effect on appetite.    Spoke with provider at facility who agrees with hospice services for this patient.  Have reached out to hospice physicians who agree with hospice eligibility.  Have reached out referral office awaiting hospice referral from facility  I spent 45 minutes providing this consultation,  from 9:00 to 9:45 including time spent with patient/family, chart review, provider coordination, documentation. More than 50% of the time in this consultation was spent coordinating communication.   HISTORY OF PRESENT ILLNESS:  Dylan Ball is a 86 y.o. 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 Palliative Care was asked to help address goals of care. Patient was hospitalized August of this year due to COVID pneumonia. He had a fall 03/01/20 and was in hospital til 8/27/21and was found to have resultant bilateral SAH, which are stable at this time.  He had a fall 04/02/20 and was evaluated in ER with no acute findings.  Patient is unable to contribute to ROS to dementia.  He has stage IV wound to left heel being taken care of wound nurse at facility.  CODE STATUS:   PPS: 30% HOSPICE ELIGIBILITY/DIAGNOSIS: yes/dementia and PCM  PHYSICAL EXAM:   General: NAD, frail appearing, thin Cardiovascular: regular rate and rhythm Pulmonary: lung sounds diminished with no abnormal breath sounds heard; normal respiratory effort Abdomen: soft, nontender, + bowel sounds GU: no suprapubic tenderness Extremities: no edema, no joint deformities Skin: no rashes on exposed skin Neurological: Weakness; patient does not open eyes to touch but responds to staff member feeding him   PAST MEDICAL HISTORY:  Past Medical History:  Diagnosis Date  . Dementia (HCC)   . Hypertension   . Peripheral vascular disease (HCC)     SOCIAL HX:  Social History   Tobacco Use  . Smoking status: Never Smoker  . Smokeless tobacco: Never Used  Substance Use Topics  . Alcohol use: Not Currently  ALLERGIES:  Allergies  Allergen Reactions  . Bactrim [Sulfamethoxazole-Trimethoprim] Other (See Comments)    Unknown reaction - listed on [facility] 03/01/2020  . Butalbital-Asa-Caff-Codeine Other (See Comments)    Unknown reaction - listed on [facility] 03/01/2020   . Ciprofloxacin Other (See Comments)    Unknown reaction - listed on [facility] 03/01/2020   . Ciprofloxacin-Dexamethasone Other (See Comments)    Unknown reaction - listed on [facility] 03/01/2020   .  Codeine Other (See Comments)    Unknown reaction - listed on [facility] 03/01/2020   . Guaifenesin-Codeine Other (See Comments)    Unknown reaction - listed on [facility] 03/01/2020   . Morphine And Related Other (See Comments)    Unknown reaction - listed on [facility] 03/01/2020   . Vicodin [Hydrocodone-Acetaminophen] Other (See Comments)    Unknown reaction - listed on [facility] 03/01/2020      PERTINENT MEDICATIONS:  Outpatient Encounter Medications as of 05/28/2020  Medication Sig  . docusate sodium (COLACE) 100 MG capsule Take 1 capsule (100 mg total) by mouth 2 (two) times daily.  . feeding supplement, ENSURE ENLIVE, (ENSURE ENLIVE) LIQD Take 237 mLs by mouth 3 (three) times daily with meals.  . levETIRAcetam (KEPPRA) 500 MG tablet Take 1 tablet (500 mg total) by mouth 2 (two) times daily for 5 days.  . Melatonin 10 MG TABS Take 10 mg by mouth at bedtime.  . polyethylene glycol (MIRALAX / GLYCOLAX) 17 g packet Take 17 g by mouth daily as needed for moderate constipation.  . QUEtiapine (SEROQUEL) 25 MG tablet Take 25 mg by mouth at bedtime.   No facility-administered encounter medications on file as of 05/28/2020.     Lorre Opdahl Marlena Clipper, NP

## 2020-06-09 DEATH — deceased

## 2021-10-11 IMAGING — CT CT HEAD W/O CM
3 series · 15 of 47 positions shown, 18 images · non-contrast
Comparison: Head CT 03/02/2020

CLINICAL DATA: Unwitnessed fall with laceration to left side of
head. Dementia history.

EXAM:
CT HEAD WITHOUT CONTRAST
TECHNIQUE: Contiguous axial images were obtained from the base of the skull
through the vertex without intravenous contrast.

[Series 4: head 5.0 h30s · axial · 0.44mm/px · z∈[-134,+1]mm · 9 of 33 slices shown, 12 images]
[im 3/33  brain]
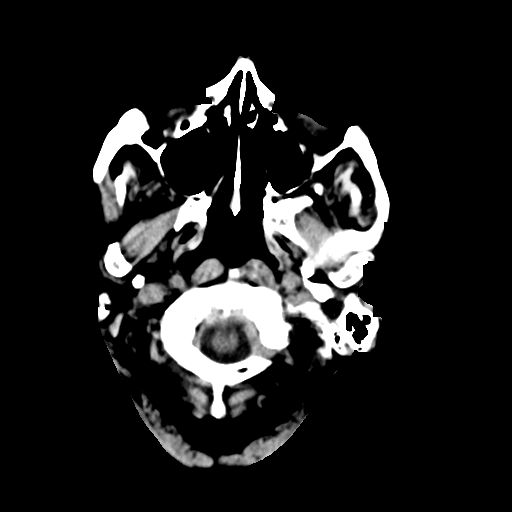
[im 3/33  bone]
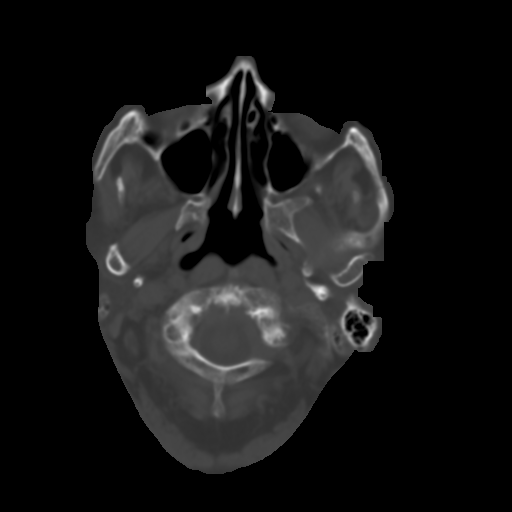
[im 6/33  brain]
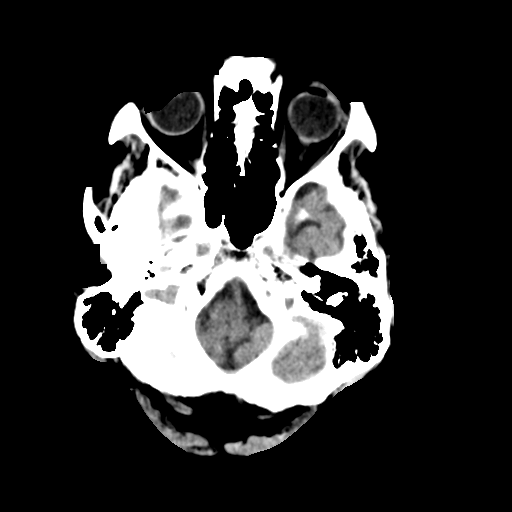
[im 9/33  brain]
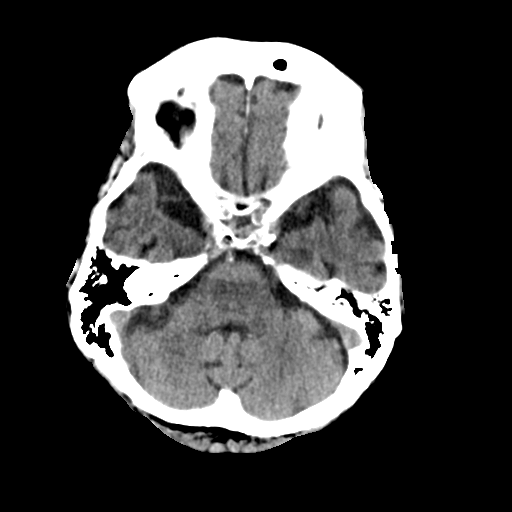
[im 13/33  brain]
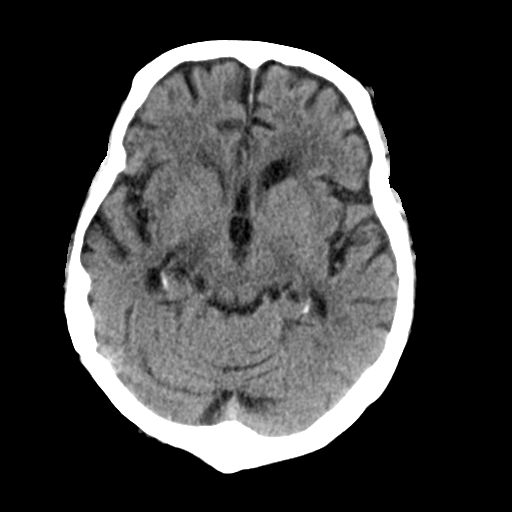
[im 17/33  brain]
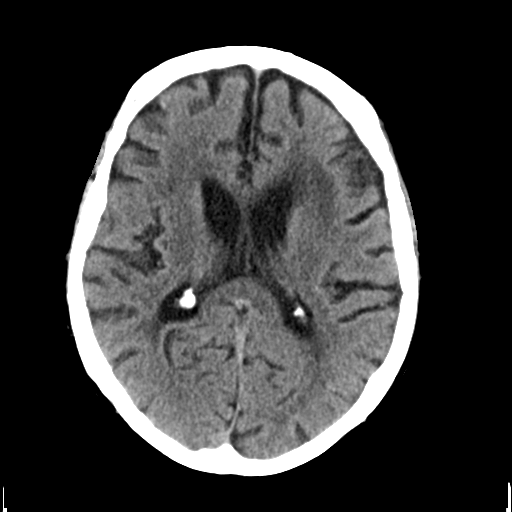
[im 17/33  bone]
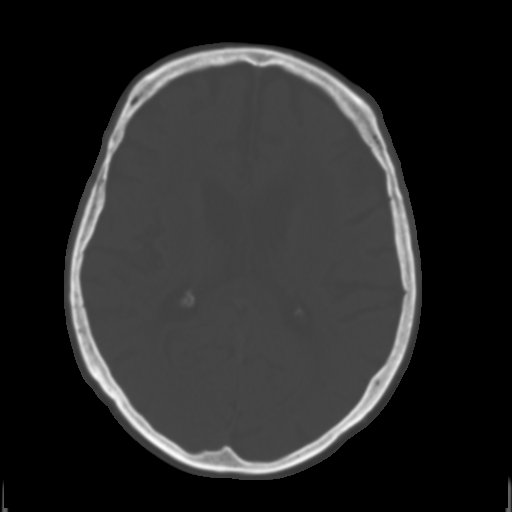
[im 20/33  brain]
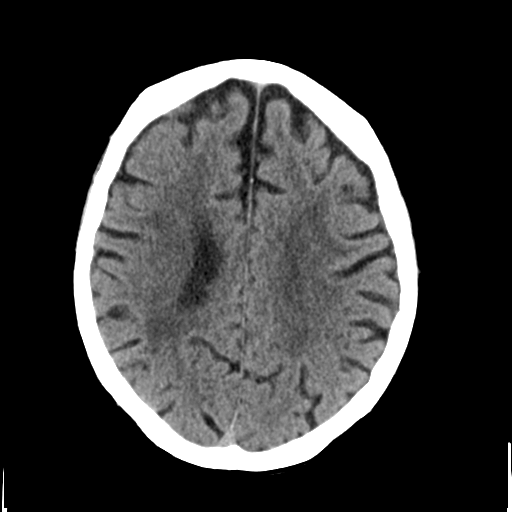
[im 24/33  brain]
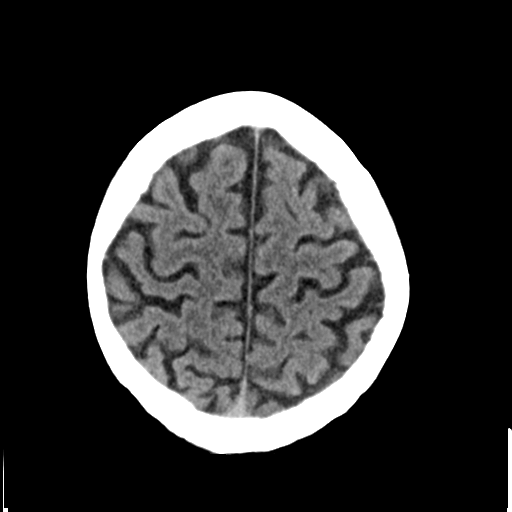
[im 27/33  brain]
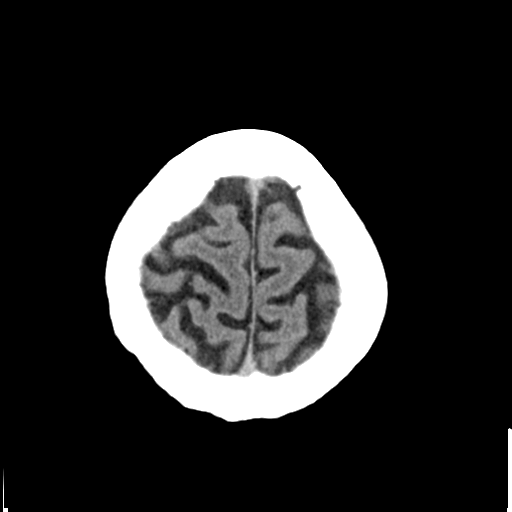
[im 30/33  brain]
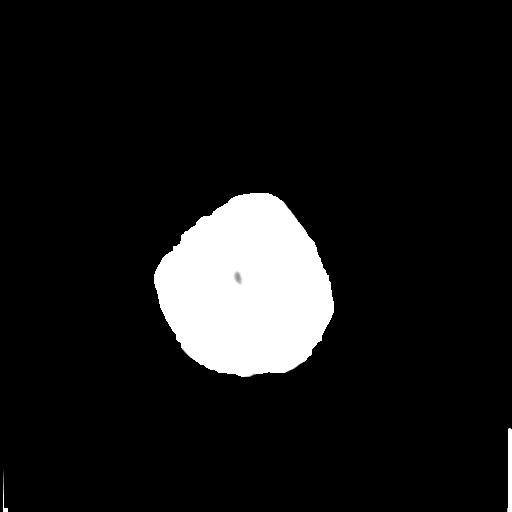
[im 30/33  bone]
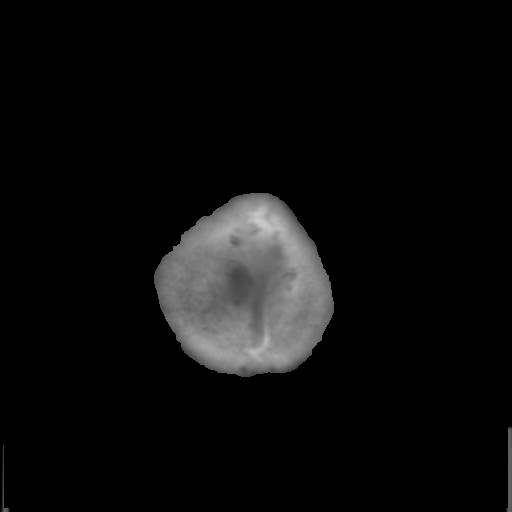

[Series 5: head 3.0 mpr cor · coronal · 0.33mm/px · 3 of 72 slices shown]
[im 24/72  brain]
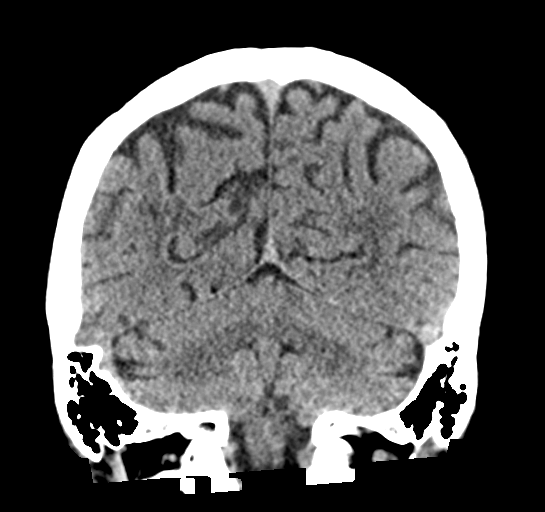
[im 32/72  brain]
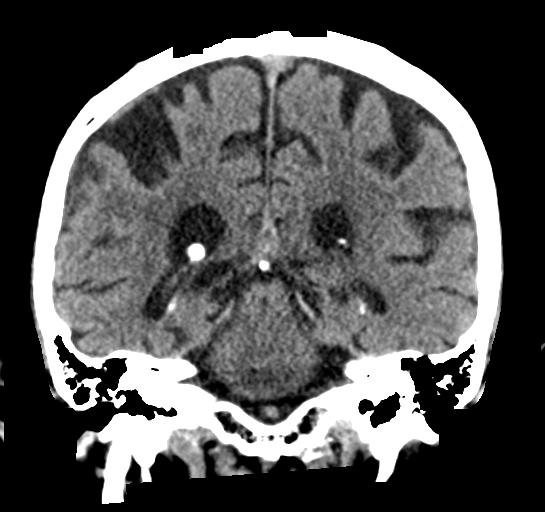
[im 40/72  brain]
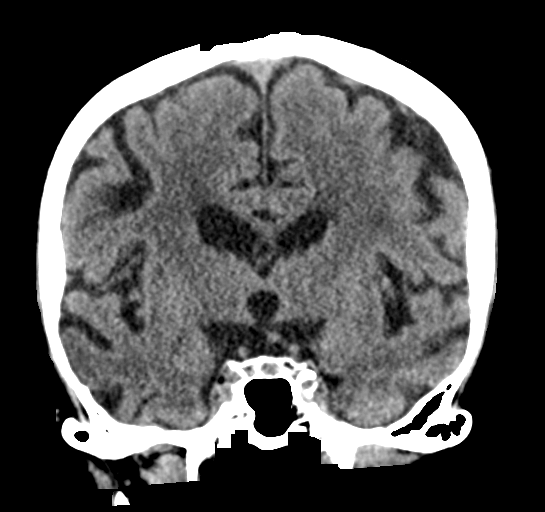

[Series 6: head 3.0 mpr sag · sagittal · 0.33mm/px · 3 of 61 slices shown]
[im 21/61  brain]
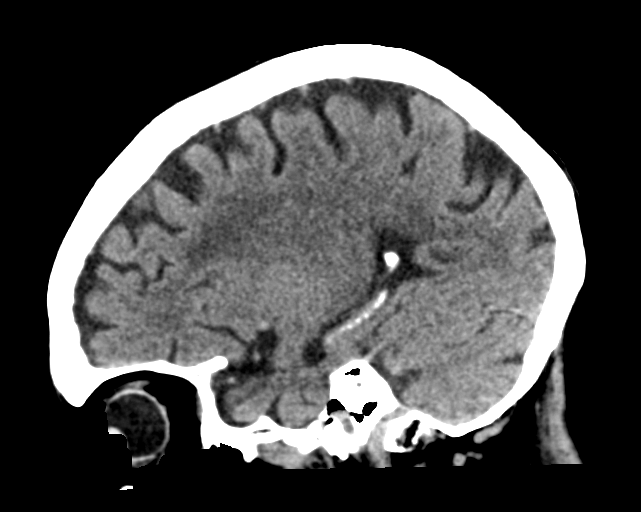
[im 31/61  brain]
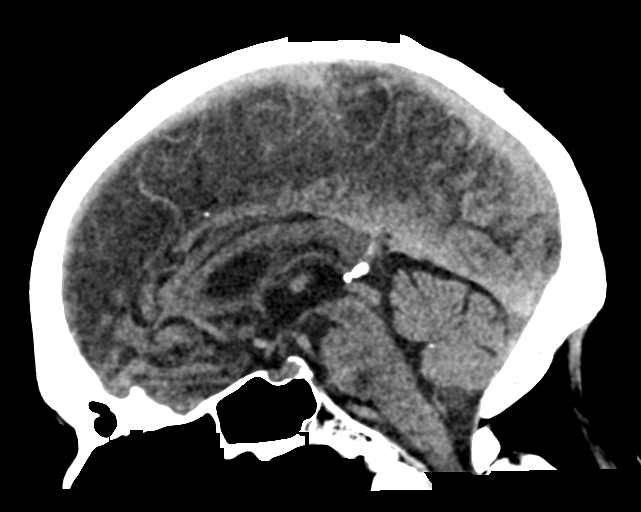
[im 41/61  brain]
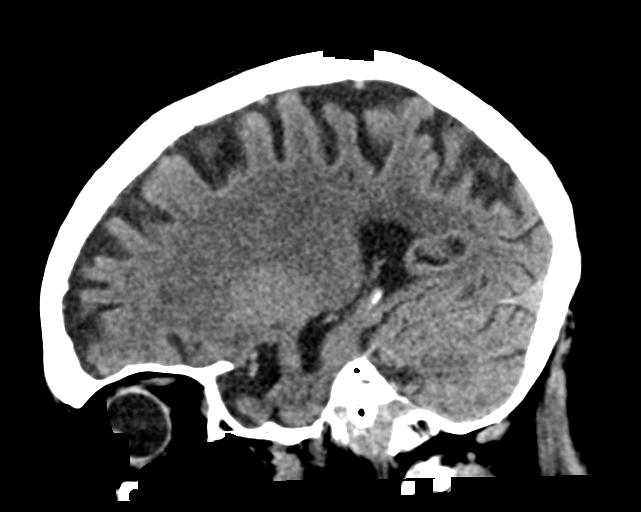

[15 of 47 positions shown; findings below may reference images not displayed]

FINDINGS: Brain: Stable degree of atrophy and chronic small vessel ischemia.
Previous subarachnoid hemorrhage has resolved. No intracranial
hemorrhage, mass effect, or midline shift. No hydrocephalus. The
basilar cisterns are patent. No evidence of territorial infarct or
acute ischemia. No extra-axial or intracranial fluid collection.

Vascular: Atherosclerosis of skullbase vasculature without
hyperdense vessel or abnormal calcification.

Skull: No fracture or focal lesion.

Sinuses/Orbits: Paranasal sinuses and mastoid air cells are clear.
The visualized orbits are unremarkable. Previous left maxillary
sinus retention cyst not included in the field of view.

Other: Small left frontal scalp hematoma.
IMPRESSION: 1. Small left frontal scalp hematoma. Previous right-sided
subarachnoid hemorrhage has resolved. No acute intracranial
abnormality. No skull fracture.
2. Stable atrophy and chronic small vessel ischemia.
